# Patient Record
Sex: Female | Born: 1970
Health system: Southern US, Community
[De-identification: ages and names within clinical notes are randomized; demographics above are authoritative.]

## PROBLEM LIST (undated history)

## (undated) DIAGNOSIS — T7840XA Allergy, unspecified, initial encounter: Secondary | ICD-10-CM

## (undated) DIAGNOSIS — K219 Gastro-esophageal reflux disease without esophagitis: Secondary | ICD-10-CM

## (undated) DIAGNOSIS — F329 Major depressive disorder, single episode, unspecified: Secondary | ICD-10-CM

## (undated) DIAGNOSIS — R87612 Low grade squamous intraepithelial lesion on cytologic smear of cervix (LGSIL): Secondary | ICD-10-CM

## (undated) DIAGNOSIS — F32A Depression, unspecified: Secondary | ICD-10-CM

## (undated) DIAGNOSIS — D571 Sickle-cell disease without crisis: Secondary | ICD-10-CM

## (undated) DIAGNOSIS — E785 Hyperlipidemia, unspecified: Secondary | ICD-10-CM

## (undated) DIAGNOSIS — R87610 Atypical squamous cells of undetermined significance on cytologic smear of cervix (ASC-US): Secondary | ICD-10-CM

## (undated) DIAGNOSIS — R87810 Cervical high risk human papillomavirus (HPV) DNA test positive: Secondary | ICD-10-CM

## (undated) HISTORY — DX: Depression, unspecified: F32.A

## (undated) HISTORY — PX: MYOMECTOMY: SHX85

## (undated) HISTORY — DX: Allergy, unspecified, initial encounter: T78.40XA

## (undated) HISTORY — DX: Atypical squamous cells of undetermined significance on cytologic smear of cervix (ASC-US): R87.610

## (undated) HISTORY — DX: Gastro-esophageal reflux disease without esophagitis: K21.9

## (undated) HISTORY — DX: Hyperlipidemia, unspecified: E78.5

## (undated) HISTORY — DX: Major depressive disorder, single episode, unspecified: F32.9

## (undated) HISTORY — DX: Low grade squamous intraepithelial lesion on cytologic smear of cervix (LGSIL): R87.612

## (undated) HISTORY — DX: Cervical high risk human papillomavirus (HPV) DNA test positive: R87.810

## (undated) HISTORY — DX: Sickle-cell disease without crisis: D57.1

---

## 1991-11-02 HISTORY — PX: CHOLECYSTECTOMY: SHX55

## 1994-11-01 HISTORY — PX: TUBAL LIGATION: SHX77

## 2004-05-16 ENCOUNTER — Emergency Department (HOSPITAL_COMMUNITY): Admission: EM | Admit: 2004-05-16 | Discharge: 2004-05-16 | Payer: Self-pay | Admitting: Emergency Medicine

## 2007-11-12 ENCOUNTER — Emergency Department (HOSPITAL_COMMUNITY): Admission: EM | Admit: 2007-11-12 | Discharge: 2007-11-12 | Payer: Self-pay | Admitting: Family Medicine

## 2010-05-01 DIAGNOSIS — R87612 Low grade squamous intraepithelial lesion on cytologic smear of cervix (LGSIL): Secondary | ICD-10-CM

## 2010-05-01 HISTORY — DX: Low grade squamous intraepithelial lesion on cytologic smear of cervix (LGSIL): R87.612

## 2010-05-01 HISTORY — PX: COMBINED HYSTEROSCOPY DIAGNOSTIC / D&C: SUR297

## 2010-05-12 ENCOUNTER — Ambulatory Visit: Payer: Self-pay | Admitting: Gynecology

## 2010-05-18 ENCOUNTER — Ambulatory Visit: Payer: Self-pay | Admitting: Gynecology

## 2010-05-25 ENCOUNTER — Ambulatory Visit: Payer: Self-pay | Admitting: Gynecology

## 2010-05-29 ENCOUNTER — Ambulatory Visit: Payer: Self-pay | Admitting: Gynecology

## 2010-05-29 ENCOUNTER — Ambulatory Visit (HOSPITAL_BASED_OUTPATIENT_CLINIC_OR_DEPARTMENT_OTHER): Admission: RE | Admit: 2010-05-29 | Discharge: 2010-05-29 | Payer: Self-pay | Admitting: Gynecology

## 2010-06-16 ENCOUNTER — Ambulatory Visit: Payer: Self-pay | Admitting: Gynecology

## 2010-09-22 ENCOUNTER — Ambulatory Visit: Payer: Self-pay | Admitting: Gynecology

## 2010-10-09 ENCOUNTER — Ambulatory Visit: Payer: Self-pay | Admitting: Gynecology

## 2010-11-13 ENCOUNTER — Ambulatory Visit
Admission: RE | Admit: 2010-11-13 | Discharge: 2010-11-13 | Payer: Self-pay | Source: Home / Self Care | Attending: Gynecology | Admitting: Gynecology

## 2010-11-13 ENCOUNTER — Other Ambulatory Visit
Admission: RE | Admit: 2010-11-13 | Discharge: 2010-11-13 | Payer: Self-pay | Source: Home / Self Care | Admitting: Gynecology

## 2011-01-18 ENCOUNTER — Ambulatory Visit (INDEPENDENT_AMBULATORY_CARE_PROVIDER_SITE_OTHER): Payer: 59

## 2011-01-18 DIAGNOSIS — D259 Leiomyoma of uterus, unspecified: Secondary | ICD-10-CM

## 2011-01-18 DIAGNOSIS — N920 Excessive and frequent menstruation with regular cycle: Secondary | ICD-10-CM

## 2011-04-02 HISTORY — PX: OTHER SURGICAL HISTORY: SHX169

## 2011-04-05 ENCOUNTER — Institutional Professional Consult (permissible substitution) (INDEPENDENT_AMBULATORY_CARE_PROVIDER_SITE_OTHER): Payer: 59 | Admitting: Gynecology

## 2011-04-05 DIAGNOSIS — N920 Excessive and frequent menstruation with regular cycle: Secondary | ICD-10-CM

## 2011-04-05 DIAGNOSIS — D259 Leiomyoma of uterus, unspecified: Secondary | ICD-10-CM

## 2011-04-07 NOTE — H&P (Signed)
NAMEMEAGHAN, Carrie Romero               ACCOUNT NO.:  [id number]  MEDICAL RECORD NO.:  [id number]  LOCATION:                                FACILITY:  [facility]  PHYSICIAN:  Charlese Gruetzmacher P. Lutisha Knoche, M.D.DATE OF BIRTH:  26-May-1971  DATE OF ADMISSION:  04/20/2011 DATE OF DISCHARGE:                             HISTORY & PHYSICAL   CHIEF COMPLAINT:  Menorrhagia and iron-deficiency anemia.  History of low-grade SIL changes on Pap smear.  HISTORY OF PRESENT ILLNESS:  A 40 year old G3, P1, AB 1 female status post tubal sterilization long history of heavy menses lasting 7 days requiring frequent pad changes and bleed through episodes.  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 She had trial of hormonal manipulation, but again has continued to bleed heavily and wants to proceed with definitive surgery to include attempted TLH.  She has been on Depo Lupron for menstrual suppression to allow for hemoglobin recovery preoperatively.  Past surgical history includes cesarean section, cholecystectomy, tubal sterilization, hysteroscopy.  Past medical history includes migraine headaches.  CURRENT MEDICATIONS:  Imitrex p.r.n. and ferrous sulfate daily.  ALLERGIES:  No medications.  REVIEW OF SYSTEMS:  Noncontributory.  FAMILY HISTORY:  Noncontributory.  SOCIAL HISTORY:  Noncontributory.  ADMISSION PHYSICAL EXAMINATION:  VITAL SIGNS: Afebrile.  Stable. HEENT:  Normal. LUNGS:  Clear. CARDIAC:  Regular rate.  No rubs, murmurs, or gallops. ABDOMEN:  Benign. PELVIC:  External BUS, vagina normal.  Cervix normal.  Uterus retroverted, irregular consistent with myomas.  Adnexa without gross masses or tenderness.  ASSESSMENT:  A 40 year old G3, P1, AB 2 female status post tubal sterilization, history of menorrhagia low-grade dysplasia, status post hysteroscopy D and C, attempted hormonal manipulation  without success. She wants to proceed with hysterectomy.  I reviewed the options to include continued attempts at hormonal manipulation, Jearld Adjutant IUD, Depo- Provera, re-hysteroscopy, attempted multiple myomectomy all of which she rejects and wants to proceed with hysterectomy.  I discussed the various approaches to the hysterectomy to include attempted [facility] laparoscopic- assisted TAH and I think certainly given the situation with past cesarean section attempted  TLH certainly as appropriate, although I did review with her that depending on location of her myomas that this may not be possible that we may need to proceed with a TAH.  She understands the possibility for larger incision a longer recovery all of which she understands and accepts.  Absolute irreversible sterility associated with hysterectomy was reviewed as well as sexuality following hysterectomy and the potential for persistent orgasmic dysfunction as well as persistent dyspareunia was discussed, understood, and accepted. The ovarian conservation issue was also reviewed the options of keeping both ovaries for continued hormone production recognizing the potential for ovarian disease in the future both benign and malignant requiring reoperation or treatment was discussed versus removing both ovaries and the risk of hypoestrogenic some to include accelerated cardiovascular risk Osteoporosis potential need for hormone replacement therapy and the risks of hormone replacement therapy was discussed with the WHI study was all reviewed with her.  She wants to keep both ovaries, although she does give me permission  to remove one or both ovaries if at my discretion intraoperatively I feel as the best choice, and she clearly accepts the risks for ovarian disease in the future, and the potential for reoperation as well as the risk of ovarian cancer.  Expected intraoperative courses were reviewed with her as well as instrumentation, trocar  placement, multiple port sites, use of electrocautery, harmonic scalpel, clips, laser were all reviewed and again the risk that we will convert to an open procedure with a total abdominal hysterectomy was discussed, understood, and accepted.  The risk of infection requiring prolonged antibiotics as well as the risk of abscess formation requiring reoperation abscess drainage was reviewed with her.  The risk of hemorrhage necessitating transfusion and the risks of transfusion including transfusion reaction, hepatitis, HIV, mad cow disease and other unknown entities was all discussed, understood, and accepted.  The risk of inadvertent injury to internal organs either immediately recognized or delayed recognized including bowel, bladder ureters, vessels, and nerves necessitating major exploratory reparative surgeries and future reparative surgeries, bowel resection, bladder repair, ureteral damage repair, ostomy formation was all discussed, understood and accepted.  The patient's questions were answered to her satisfaction.  She is ready to proceed with surgery.     Carrie Romero P. Audie Box, M.D.     TPF/MEDQ  D:  04/05/2011  T:  04/05/2011  Job:  [id number]  Electronically Signed by Colin Broach M.D. on 04/07/2011 02:54:16 PM

## 2011-04-13 ENCOUNTER — Encounter (HOSPITAL_COMMUNITY): Payer: 59

## 2011-04-13 ENCOUNTER — Other Ambulatory Visit: Payer: Self-pay | Admitting: Gynecology

## 2011-04-13 LAB — CBC
Hemoglobin: 11.1 g/dL — ABNORMAL LOW (ref 12.0–15.0)
MCH: 26.6 pg (ref 26.0–34.0)
MCV: 81.1 fL (ref 78.0–100.0)

## 2011-04-20 ENCOUNTER — Other Ambulatory Visit: Payer: Self-pay | Admitting: Gynecology

## 2011-04-20 ENCOUNTER — Ambulatory Visit (HOSPITAL_COMMUNITY)
Admission: RE | Admit: 2011-04-20 | Discharge: 2011-04-21 | Disposition: A | Discharge status: Home / Self Care | Payer: 59 | Source: Ambulatory Visit | Attending: Gynecology | Admitting: Gynecology

## 2011-04-20 DIAGNOSIS — Z01812 Encounter for preprocedural laboratory examination: Secondary | ICD-10-CM | POA: Insufficient documentation

## 2011-04-20 DIAGNOSIS — N8000 Endometriosis of the uterus, unspecified: Secondary | ICD-10-CM | POA: Insufficient documentation

## 2011-04-20 DIAGNOSIS — N871 Moderate cervical dysplasia: Secondary | ICD-10-CM

## 2011-04-20 DIAGNOSIS — N926 Irregular menstruation, unspecified: Secondary | ICD-10-CM

## 2011-04-20 DIAGNOSIS — Z01818 Encounter for other preprocedural examination: Secondary | ICD-10-CM | POA: Insufficient documentation

## 2011-04-20 DIAGNOSIS — N920 Excessive and frequent menstruation with regular cycle: Secondary | ICD-10-CM | POA: Insufficient documentation

## 2011-04-20 DIAGNOSIS — D251 Intramural leiomyoma of uterus: Secondary | ICD-10-CM | POA: Insufficient documentation

## 2011-04-20 DIAGNOSIS — D259 Leiomyoma of uterus, unspecified: Secondary | ICD-10-CM

## 2011-04-20 DIAGNOSIS — N879 Dysplasia of cervix uteri, unspecified: Secondary | ICD-10-CM | POA: Insufficient documentation

## 2011-04-20 DIAGNOSIS — D509 Iron deficiency anemia, unspecified: Secondary | ICD-10-CM | POA: Insufficient documentation

## 2011-04-20 DIAGNOSIS — D252 Subserosal leiomyoma of uterus: Secondary | ICD-10-CM | POA: Insufficient documentation

## 2011-04-21 LAB — CBC
HCT: 27 % — ABNORMAL LOW (ref 36.0–46.0)
MCV: 80.8 fL (ref 78.0–100.0)
RBC: 3.34 MIL/uL — ABNORMAL LOW (ref 3.87–5.11)
WBC: 10.3 10*3/uL (ref 4.0–10.5)

## 2011-04-22 NOTE — Discharge Summary (Signed)
  NAMESHANTICE, MENGER               ACCOUNT NO.:  [id number]  MEDICAL RECORD NO.:  [id number]  LOCATION:  9306                          FACILITY:  [facility]  PHYSICIAN:  Donelle Baba P. Clorinda Wyble, M.D.DATE OF BIRTH:  1971/09/28  DATE OF ADMISSION:  04/20/2011 DATE OF DISCHARGE:  04/21/2011                              DISCHARGE SUMMARY   DISCHARGE DIAGNOSES:  Menorrhagia, leiomyoma, iron-deficiency anemia, low-grade cervical dysplasia.  PROCEDURE:  Total laparoscopic hysterectomy, April 20, 2011.  PATHOLOGY:  [id number]  inactive endometrium, leiomyomata, adenomyosis, no residual dysplasia.  HOSPITAL COURSE:  A 40 year old underwent uncomplicated total laparoscopic hysterectomy, April 20, 2011.  Her postoperative course was uncomplicated.  She was discharged on postoperative day #1, ambulating well, tolerating a regular diet, voiding with a postoperative hemoglobin of 8.9, preoperative hemoglobin 11.1.  The patient received discharge instructions, precautions, and followup.  She will be seen in the office 2 weeks following discharge and received a prescription for Tylox #25 one to two p.o. q.6 h. p.r.n. pain and Phenergan 25 mg #15 one p.o. q.6 h. p.r.n. nausea.     Billyjack Trompeter P. Audie Box, M.D.     TPF/MEDQ  D:  04/21/2011  T:  04/22/2011  Job:  [id number]  Electronically Signed by Colin Broach M.D. on 04/22/2011 03:49:23 PM

## 2011-04-22 NOTE — Op Note (Signed)
NAMETYQUASIA, PANT               ACCOUNT NO.:  [id number]  MEDICAL RECORD NO.:  [id number]  LOCATION:  9306                          FACILITY:  [facility]  PHYSICIAN:  Maddux Vanscyoc P. Janaisha Tolsma, M.D.DATE OF BIRTH:  19-May-1971  DATE OF PROCEDURE:  04/20/2011 DATE OF DISCHARGE:                              OPERATIVE REPORT   PREOPERATIVE DIAGNOSES:  Menorrhagia, leiomyoma, iron-deficiency anemia, cervical dysplasia low grade.  POSTOPERATIVE DIAGNOSES:  Menorrhagia, leiomyoma, iron-deficiency anemia, cervical dysplasia low grade.  PROCEDURE:  Total laparoscopic hysterectomy.  SURGEON:  Alisandra Son P. Dailynn Nancarrow, MD  ASSISTANT:  Lily Peer.  ANESTHESIA:  General.  ESTIMATED BLOOD LOSS:  100 mL.  COMPLICATIONS:  None.  SPECIMEN:  Uterus to Pathology.  FINDINGS:  EUA, external BUS, vagina normal.  Cervix normal.  Bimanual uterus normal size, midline mobile.  Adnexa without masses. Laparoscopic anterior cul-de-sac.  Mild vesicouterine peritoneal scarring from previous C- section, posterior cul-de-sac, normal uterus, overall normal in size, irregular consistent with subserosal and intramural leiomyoma.  Right and left fallopian tubes with evidence of prior tubal sterilization, otherwise normal.  Right and left ovaries grossly normal free and mobile.  Upper abdominal exam was grossly normal, liver smooth, no significant perihepatic adhesions noted or upper abdominal pathology.  PROCEDURE:  The patient was taken to the operating room, underwent general anesthesia, placed in low dorsal lithotomy position, received abdominal perineal vaginal preparation with Betadine solution.  EUA performed.  Cervix visualized with a speculum.  Anterior lip was grasped with a  single-tooth tenaculum.  Uterus was sounded and cervix measured and subsequently the appropriate sized RUMI manipulator and CLO cup were placed without difficulty.  The intrauterine balloon insufflated.  The tenaculum removed and the cup  seated in the upper vagina around the cervix.  An indwelling Foley catheterization was then performed.  The patient draped in usual fashion.  A repeat transverse infraumbilical incision was made using the 10-mm direct entry Optiview trocar.  The abdomen was directly entered under direct visualization without difficulty and subsequently insufflated.  Right and left 5-mm suprapubic ports were then placed under direct visualization after transillumination for the best without difficulty.  Examination of pelvic organs, upper abdominal exam was carried out with findings noted above.  Using the harmonic scalpel, the right uterine ovarian pedicle was identified, isolated, and transected.  The broad ligament again transected to the level of the round ligament using the harmonic scalpel.  The round ligament was then transected the parametrial planes developed.  The uterine artery was visualized, bipolar cauterized to a flow of zero and subsequently transected with a harmonic scalpel.  The similar procedure was then carried out on the other side.  The vesicouterine peritoneal fold was then developed using the harmonic scalpel and subsequently the vagina was entered anteriorly over lying the KOH cup under pressure, using the harmonic scalpel.  The uterus was then circumferentially excised from the vagina again using the harmonic scalpel without difficulty and the freed uterine specimen was then withdrawn into the vagina to maintain the pneumoperitoneum.  Using the Endo stitch suture applier 0 Vicryl figure-of-eight interrupted sutures were placed to close the vagina starting with two angle sutures and then closing the intervening vaginal space.  Pelvis was copiously irrigated. Several small bleeding points were addressed with a bipolar cautery. The gas allowed to slowly escape and the pelvis was reinspected under low-pressure site as well as the ovarian pedicles all of which showed adequate  hemostasis under low pressure.  Of note for use of the Endo stitch, the right lower 5-mm suprapubic port was replaced with a 10-mm port again under direct visualization after transillumination for the vessels without difficulty.  The suprapubic ports were all removed and the procedure.  Hemostasis again visualized at the peritoneal port sites and the infraumbilical port was then backed out under direct visualization showing adequate hemostasis.  No evidence of hernia formation.  All skin incision sites were injected using 0.25% Marcaine interrupted TED 0 Vicryl subcutaneous fascial stitch was placed at the infraumbilical and the right lower suprapubic port site and all skin incisions were closed using 4-0 plain suture in interrupted cuticular stitch.  Sterile dressings were applied.  Bimanual assured secure vaginal closure.  Specimen was sent to Pathology noting cervical dysplasia in the history, clear free-flowing yellow urine was noted in the Foley at the end of the procedure.  The patient received intraoperative Toradol was awakened without difficulty and taken to recovery room in good condition having tolerated the procedure well.     Kingslee Dowse P. Audie Box, M.D.     TPF/MEDQ  D:  04/20/2011  T:  04/21/2011  Job:  [id number]  Electronically Signed by Colin Broach M.D. on 04/22/2011 09:16:25 AM

## 2011-05-04 ENCOUNTER — Ambulatory Visit (INDEPENDENT_AMBULATORY_CARE_PROVIDER_SITE_OTHER): Payer: 59 | Admitting: Gynecology

## 2011-05-04 DIAGNOSIS — N949 Unspecified condition associated with female genital organs and menstrual cycle: Secondary | ICD-10-CM

## 2011-05-26 ENCOUNTER — Ambulatory Visit: Payer: 59 | Admitting: Gynecology

## 2011-05-26 ENCOUNTER — Encounter: Payer: Self-pay | Admitting: Gynecology

## 2011-05-26 ENCOUNTER — Ambulatory Visit (INDEPENDENT_AMBULATORY_CARE_PROVIDER_SITE_OTHER): Payer: 59 | Admitting: Gynecology

## 2011-05-26 DIAGNOSIS — Z9889 Other specified postprocedural states: Secondary | ICD-10-CM

## 2011-05-26 NOTE — Progress Notes (Signed)
Patient presents postop approximately a month from her TLH doing well with no complaints.  Exam today: abdominal incisions healed nicely. Pelvic exam external BUS vagina normal cuff intact bimanual without masses or tenderness  Assessment and plan: 1 month postop status post TL H. Doing well pathology showed adenomyosis and leiomyoma. She did have history of low-grade SIL Pap smear in July a year ago with a normal Pap in January. I recommended she continue with annual Pap smears of the vaginal cuff at this point given this history. She is 39 and has not had a mammogram I recommended she get a screening mammogram and otherwise followup in 6 months for an annual exam. I filled out paperwork to return to work now she'll return on August 1 and I recommended that abstaining from intercourse for another 2 weeks to be 6 weeks out. Assuming she does well then she'll followup at her annual exam.

## 2011-06-01 ENCOUNTER — Telehealth: Payer: Self-pay | Admitting: *Deleted

## 2011-06-01 NOTE — Telephone Encounter (Signed)
Not unusual to spot. As long as she's not having any other symptoms then I would watch for now. If it continues or certainly worsens worsens office visit

## 2011-06-01 NOTE — Telephone Encounter (Signed)
PT INFORMED WITH THE BELOW,PT WILL MAKE OV IF SYMPTOMS WORSEN.

## 2011-06-01 NOTE — Telephone Encounter (Signed)
(  PT HAD TLH SURGERY ON 04/20/11) FYI PT WANTED TO LET KNOW THAT TODAY SHE NOTICED SOME SPOTTING WHEN SHE WENT TO BATHROOM. PT STATES SHE FEELS FINE. BUT WANTED YOU TO KNOW ABOUT THE SPOTTING. PLEASE ADVISE.

## 2011-06-04 ENCOUNTER — Other Ambulatory Visit: Payer: Self-pay | Admitting: *Deleted

## 2011-06-04 MED ORDER — PROMETHAZINE HCL 25 MG PO TABS: 25.0000 mg | ORAL_TABLET | Freq: Four times a day (QID) | ORAL | Status: AC | PRN

## 2011-06-04 MED ORDER — FLUCONAZOLE 150 MG PO TABS: 150.0000 mg | ORAL_TABLET | Freq: Once | ORAL | Status: AC

## 2011-07-22 LAB — POCT URINALYSIS DIP (DEVICE)
Bilirubin Urine: NEGATIVE
Ketones, ur: NEGATIVE
Ketones, ur: NEGATIVE
Leukocytes, UA: NEGATIVE
Operator id: 235561
Protein, ur: NEGATIVE
Specific Gravity, Urine: 1.02
Urobilinogen, UA: 0.2
pH: 5.5

## 2011-07-22 LAB — GC/CHLAMYDIA PROBE AMP, GENITAL: GC Probe Amp, Genital: NEGATIVE

## 2011-11-08 ENCOUNTER — Telehealth: Payer: Self-pay | Admitting: *Deleted

## 2011-11-08 DIAGNOSIS — Z131 Encounter for screening for diabetes mellitus: Secondary | ICD-10-CM

## 2011-11-08 DIAGNOSIS — Z13220 Encounter for screening for lipoid disorders: Secondary | ICD-10-CM

## 2011-11-08 DIAGNOSIS — Z01419 Encounter for gynecological examination (general) (routine) without abnormal findings: Secondary | ICD-10-CM

## 2011-11-08 NOTE — Telephone Encounter (Signed)
Okay for CBC, comprehensive metabolic panel, lipid profile, urinalysis and TSH. Orders were placed

## 2011-11-08 NOTE — Telephone Encounter (Signed)
Pt has annual scheduled for jan 23, pt would like to have labs drawn prior to appointment, pt asked if she could have TSH, CMP, CBC with routine labs. Okay to do labs?

## 2011-11-08 NOTE — Telephone Encounter (Signed)
Lm on pt vm okay to do this.

## 2011-11-18 ENCOUNTER — Other Ambulatory Visit: Payer: 59

## 2011-11-18 ENCOUNTER — Other Ambulatory Visit: Payer: Self-pay | Admitting: Gynecology

## 2011-11-18 DIAGNOSIS — Z131 Encounter for screening for diabetes mellitus: Secondary | ICD-10-CM

## 2011-11-18 DIAGNOSIS — Z01419 Encounter for gynecological examination (general) (routine) without abnormal findings: Secondary | ICD-10-CM

## 2011-11-18 DIAGNOSIS — Z113 Encounter for screening for infections with a predominantly sexual mode of transmission: Secondary | ICD-10-CM

## 2011-11-18 DIAGNOSIS — Z13220 Encounter for screening for lipoid disorders: Secondary | ICD-10-CM

## 2011-11-18 LAB — URINALYSIS, ROUTINE W REFLEX MICROSCOPIC
Bilirubin Urine: NEGATIVE
Glucose, UA: NEGATIVE mg/dL
Nitrite: NEGATIVE
Specific Gravity, Urine: 1.015 (ref 1.005–1.030)

## 2011-11-19 ENCOUNTER — Other Ambulatory Visit: Payer: Self-pay | Admitting: *Deleted

## 2011-11-19 DIAGNOSIS — E7800 Pure hypercholesterolemia, unspecified: Secondary | ICD-10-CM

## 2011-11-19 LAB — CBC WITH DIFFERENTIAL/PLATELET
Basophils Absolute: 0 10*3/uL (ref 0.0–0.1)
Eosinophils Absolute: 0 10*3/uL (ref 0.0–0.7)
Eosinophils Relative: 1 % (ref 0–5)
HCT: 36.2 % (ref 36.0–46.0)
Hemoglobin: 11.8 g/dL — ABNORMAL LOW (ref 12.0–15.0)
Lymphs Abs: 1.6 10*3/uL (ref 0.7–4.0)
MCHC: 32.6 g/dL (ref 30.0–36.0)
Monocytes Absolute: 0.5 10*3/uL (ref 0.1–1.0)
Neutro Abs: 2.3 10*3/uL (ref 1.7–7.7)
RBC: 4.29 MIL/uL (ref 3.87–5.11)
WBC: 4.4 10*3/uL (ref 4.0–10.5)

## 2011-11-19 LAB — LIPID PANEL
HDL: 48 mg/dL (ref >39)
Total CHOL/HDL Ratio: 4.6 Ratio
VLDL: 19 mg/dL (ref 0–40)

## 2011-11-19 LAB — COMPREHENSIVE METABOLIC PANEL
AST: 16 U/L (ref 0–37)
Albumin: 4.1 g/dL (ref 3.5–5.2)
Alkaline Phosphatase: 113 U/L (ref 39–117)
BUN: 9 mg/dL (ref 6–23)
CO2: 24 mEq/L (ref 19–32)
Calcium: 9 mg/dL (ref 8.4–10.5)
Sodium: 141 mEq/L (ref 135–145)
Total Bilirubin: 0.3 mg/dL (ref 0.3–1.2)

## 2011-11-24 ENCOUNTER — Ambulatory Visit (INDEPENDENT_AMBULATORY_CARE_PROVIDER_SITE_OTHER): Payer: 59 | Admitting: Gynecology

## 2011-11-24 ENCOUNTER — Other Ambulatory Visit: Payer: Self-pay | Admitting: Gynecology

## 2011-11-24 ENCOUNTER — Other Ambulatory Visit (HOSPITAL_COMMUNITY)
Admission: RE | Admit: 2011-11-24 | Discharge: 2011-11-24 | Disposition: A | Discharge status: Home / Self Care | Payer: 59 | Source: Ambulatory Visit | Attending: Gynecology | Admitting: Gynecology

## 2011-11-24 ENCOUNTER — Encounter: Payer: Self-pay | Admitting: Gynecology

## 2011-11-24 VITALS — BP 120/78 | Ht 61.5 in | Wt 173.0 lb

## 2011-11-24 DIAGNOSIS — Z01419 Encounter for gynecological examination (general) (routine) without abnormal findings: Secondary | ICD-10-CM

## 2011-11-24 DIAGNOSIS — N644 Mastodynia: Secondary | ICD-10-CM

## 2011-11-24 DIAGNOSIS — Z1231 Encounter for screening mammogram for malignant neoplasm of breast: Secondary | ICD-10-CM

## 2011-11-24 DIAGNOSIS — E7800 Pure hypercholesterolemia, unspecified: Secondary | ICD-10-CM

## 2011-11-24 NOTE — Patient Instructions (Addendum)
Schedule mammogram.  Continue with exercise program. Repeat fasting lipid profile in 6 months.  Follow up in one year for check up

## 2011-11-24 NOTE — Progress Notes (Signed)
Carrie CHAVERO 08-18-1971 [id number]        41 y.o.  for annual exam.  Status post TLH June 2012 doing well. She does note some bilateral breast tenderness since starting a high-impact exercise program.  Past medical history,surgical history, medications, allergies, family history and social history were all reviewed and documented in the EPIC chart. ROS:  Was performed and pertinent positives and negatives are included in the history.  Exam: Sherrilyn Rist chaperone present Filed Vitals:   11/24/11 0902  BP: 120/78   General appearance  Normal Skin grossly normal Head/Neck normal with no cervical or supraclavicular adenopathy thyroid normal Lungs  clear Cardiac RR, without RMG Abdominal  soft, nontender, without masses, organomegaly or hernia Breasts  examined lying and sitting without masses, retractions, discharge or axillary adenopathy. Pelvic  Ext/BUS/vagina  normal Pap of cuff done  Adnexa  Without masses or tenderness    Anus and perineum  normal   Rectovaginal  normal sphincter tone without palpated masses or tenderness.    Assessment/Plan:  41 y.o. female for annual exam.    1. Bilateral breast tenderness. Her exam is normal I think this is do to her new exercise program where she is doing a lot of jumping up and down. Recommended getting her screening mammogram. SBE monthly. As long as she does not feel any abnormalities and this tenderness resolves over time we'll follow. If it worsens persists or she feels any abnormality she knows to follow up with me. 2. Pap smear. She is status post TLH but given her history of low-grade SIL in 2011 I did a Pap smear of her cuff today. 3. Hypercholesterolemia. She had her labs drawn earlier which did show a Cholesterol 221 and an LDL of 154. I recommended continuing with her new exercise program, attempt at weight loss, low fat diet. She will repeat a fasting lipid profile in 6 months. 4. Anemia. Patient's hemoglobin is 11.8 with microcytic  hypochromic indices.  Recommend an iron supplement for now. She is status post TLH I suspect she will correct her self fairly rapidly.  Assuming she continues well then she will see me in a year, sooner as needed    Dara Lords MD, 9:21 AM 11/24/2011

## 2011-12-01 ENCOUNTER — Ambulatory Visit
Admission: RE | Admit: 2011-12-01 | Discharge: 2011-12-01 | Disposition: A | Discharge status: Home / Self Care | Payer: 59 | Source: Ambulatory Visit | Attending: Gynecology | Admitting: Gynecology

## 2011-12-01 DIAGNOSIS — Z1231 Encounter for screening mammogram for malignant neoplasm of breast: Secondary | ICD-10-CM

## 2011-12-08 ENCOUNTER — Other Ambulatory Visit: Payer: Self-pay | Admitting: *Deleted

## 2011-12-08 ENCOUNTER — Other Ambulatory Visit: Payer: Self-pay | Admitting: Gynecology

## 2011-12-08 DIAGNOSIS — N63 Unspecified lump in unspecified breast: Secondary | ICD-10-CM

## 2011-12-15 ENCOUNTER — Telehealth: Payer: Self-pay | Admitting: *Deleted

## 2011-12-15 NOTE — Telephone Encounter (Signed)
Pt informed of recent pap results.

## 2011-12-16 ENCOUNTER — Ambulatory Visit
Admission: RE | Admit: 2011-12-16 | Discharge: 2011-12-16 | Disposition: A | Discharge status: Home / Self Care | Payer: 59 | Source: Ambulatory Visit | Attending: Gynecology | Admitting: Gynecology

## 2011-12-16 DIAGNOSIS — N63 Unspecified lump in unspecified breast: Secondary | ICD-10-CM

## 2011-12-17 ENCOUNTER — Other Ambulatory Visit: Payer: 59

## 2012-02-02 ENCOUNTER — Ambulatory Visit (INDEPENDENT_AMBULATORY_CARE_PROVIDER_SITE_OTHER): Payer: 59 | Admitting: Physician Assistant

## 2012-02-02 VITALS — BP 128/79 | HR 80 | Temp 98.0°F | Resp 16 | Ht 61.0 in | Wt 170.0 lb

## 2012-02-02 DIAGNOSIS — G43909 Migraine, unspecified, not intractable, without status migrainosus: Secondary | ICD-10-CM | POA: Insufficient documentation

## 2012-02-02 DIAGNOSIS — J019 Acute sinusitis, unspecified: Secondary | ICD-10-CM

## 2012-02-02 DIAGNOSIS — D573 Sickle-cell trait: Secondary | ICD-10-CM

## 2012-02-02 MED ORDER — RANITIDINE HCL 300 MG PO TABS: 300.0000 mg | ORAL_TABLET | Freq: Every day | ORAL | Status: DC

## 2012-02-02 MED ORDER — FLUTICASONE PROPIONATE 50 MCG/ACT NA SUSP: 2.0000 | Freq: Every day | NASAL | Status: DC

## 2012-02-02 MED ORDER — AMOXICILLIN 500 MG PO CAPS: ORAL_CAPSULE | ORAL | Status: DC

## 2012-02-02 MED ORDER — AZELASTINE HCL 0.05 % OP SOLN: 1.0000 [drp] | Freq: Two times a day (BID) | OPHTHALMIC | Status: DC

## 2012-02-02 NOTE — Patient Instructions (Signed)
Cold air humidifier, cold refrigerator pack to eyes

## 2012-02-02 NOTE — Progress Notes (Signed)
  Subjective:    Patient ID: Carrie Romero, female    DOB: [birth year], 41 y.o.   MRN: [id number]  HPI 41 y.o. AAF c/o puffy, swollen, itchy eyes R>L.  She is currently under the care of an opthalmologist who is treating her with steroid/antibiotic eye drops.  She is experiencing Runny nose, sneezing, some yellow d/c from nose.  Symptoms now for about 2 weeks.  She had a recheck today with the opthalmologist, but he had to reschedule for Monday bc he is sick-her eyes do seem to be improving, but her congestion remains severe. Some mucus from nose is blood streaked.  Takes Zyrtec BID with minimal relief.  No f/c.  No cough. No vision changes.  No FB risk.  Not painful, just pressure secondary to swelling.  She had a sinus headache yesterday that worsened with leaning forward.  Review of Systems  All other systems reviewed and are negative.       Objective:   Physical Exam  Nursing note and vitals reviewed. Constitutional: She is oriented to person, place, and time. She appears well-developed and well-nourished.  HENT:  Head: Normocephalic and atraumatic.  Right Ear: External ear normal.  Left Ear: External ear normal.  Mouth/Throat: Oropharynx is clear and moist. No oropharyngeal exudate (PND no exudate).       B turbinates swollen, boggy, and bluish  Eyes: EOM are normal. Pupils are equal, round, and reactive to light. Right eye exhibits chemosis (present B lower lid/palpebral conjunctivae). Right eye exhibits no discharge, no exudate and no hordeolum. No foreign body present in the right eye. Left eye exhibits chemosis. Left eye exhibits no discharge, no exudate and no hordeolum. No foreign body present in the left eye. Right conjunctiva is not injected. Right conjunctiva has no hemorrhage. Left conjunctiva is not injected. Left conjunctiva has no hemorrhage. No scleral icterus. Right eye exhibits normal extraocular motion and no nystagmus. Left eye exhibits normal extraocular motion and no  nystagmus. Right pupil is round and reactive. Left pupil is round and reactive. Pupils are equal.    Cardiovascular: Normal rate, regular rhythm and normal heart sounds.   Pulmonary/Chest: Effort normal and breath sounds normal.  Neurological: She is alert and oriented to person, place, and time.  Skin: Skin is warm and dry.  Psychiatric: She has a normal mood and affect. Her behavior is normal.          Assessment & Plan:  Allergic rhinitis with Possible sinusitis and Allergic conjunctivitis-Continue current treatment by opthalmologist.  Add optivar, zantac (to further reduce histamine), continue Zyrtec, start flonase and cold air humidifier.  If no response or s/sx of sinusitis increase in 2 days, start amoxicillin.

## 2012-02-04 ENCOUNTER — Telehealth: Payer: Self-pay

## 2012-02-04 NOTE — Telephone Encounter (Signed)
Patient was just prescribed antibiotics but forgot to tell provider that she gets yeast infections when on them. Requests Diflucan called in, 1 extra refill if possible since she is on 3 weeks worth of antibiotics.  Note: uses [facility], which isn't open on weekends. *Please call this RX in to [facility] on [address].

## 2012-02-05 MED ORDER — FLUCONAZOLE 150 MG PO TABS: ORAL_TABLET | ORAL | Status: AC

## 2012-02-05 NOTE — Telephone Encounter (Signed)
Diflucan sent to [facility], her antibiotics are two twice daily and she should be on them for ten days (not three weeks)

## 2012-02-05 NOTE — Telephone Encounter (Signed)
Patient notified

## 2012-02-05 NOTE — Telephone Encounter (Signed)
Can we rx Diflucan for patient to [facility]?

## 2012-02-11 ENCOUNTER — Ambulatory Visit (INDEPENDENT_AMBULATORY_CARE_PROVIDER_SITE_OTHER): Payer: 59 | Admitting: Internal Medicine

## 2012-02-11 VITALS — BP 122/79 | HR 73 | Temp 98.2°F | Resp 16 | Ht 61.75 in | Wt 169.0 lb

## 2012-02-11 DIAGNOSIS — H1045 Other chronic allergic conjunctivitis: Secondary | ICD-10-CM

## 2012-02-11 DIAGNOSIS — H1010 Acute atopic conjunctivitis, unspecified eye: Secondary | ICD-10-CM

## 2012-02-11 DIAGNOSIS — J309 Allergic rhinitis, unspecified: Secondary | ICD-10-CM

## 2012-02-11 MED ORDER — PREDNISONE 20 MG PO TABS: ORAL_TABLET | ORAL | Status: DC

## 2012-02-11 NOTE — Progress Notes (Signed)
  Subjective:    Patient ID: Carrie Romero, female    DOB: Jan 10, 1971, 41 y.o.   MRN: [id number]  HPIPresents with red and swollen eyes. Was on steroid drops from her ophthalmologist but had to discontinue them last week after using them for too long and after the ophthalmologist concerned that her intraocular pressure had reached its maximum level. She has a history of spring allergies and in fact year-round allergies which often affects her eyes as well as her nose and throat. There is no history of asthma Her vision is intact but her eyes to itch and her lives are very swollen and starting to hurt.    Review of Systems     Objective:   Physical Exam Vital signs stable The lids of both eyes are red and swollen upper and lower Conjunctivae is injected throughout both eyes Pupils are equal round and reactive to light and accommodation The nares are boggy with thin clear rhinorrhea Throat is clear There no nodes Lungs are clear    Assessment & Plan:  Problem #1 allergic conjunctivitis Problem #2 allergic rhinitis  Prednisone 80 mg to 0/8 days/ocular lubricant/zyrtec 10 mg daily/Benadryl at bedtime Recheck if not responding in 48-72 hours

## 2012-02-12 ENCOUNTER — Encounter: Payer: Self-pay | Admitting: Internal Medicine

## 2012-04-30 ENCOUNTER — Ambulatory Visit (INDEPENDENT_AMBULATORY_CARE_PROVIDER_SITE_OTHER): Payer: 59 | Admitting: Emergency Medicine

## 2012-04-30 VITALS — BP 116/69 | HR 78 | Temp 98.6°F | Resp 16 | Ht 62.25 in | Wt 167.8 lb

## 2012-04-30 DIAGNOSIS — B9689 Other specified bacterial agents as the cause of diseases classified elsewhere: Secondary | ICD-10-CM

## 2012-04-30 DIAGNOSIS — B379 Candidiasis, unspecified: Secondary | ICD-10-CM

## 2012-04-30 DIAGNOSIS — N760 Acute vaginitis: Secondary | ICD-10-CM

## 2012-04-30 DIAGNOSIS — A499 Bacterial infection, unspecified: Secondary | ICD-10-CM

## 2012-04-30 DIAGNOSIS — N898 Other specified noninflammatory disorders of vagina: Secondary | ICD-10-CM

## 2012-04-30 DIAGNOSIS — N949 Unspecified condition associated with female genital organs and menstrual cycle: Secondary | ICD-10-CM

## 2012-04-30 LAB — POCT WET PREP WITH KOH: RBC Wet Prep HPF POC: NEGATIVE

## 2012-04-30 MED ORDER — METRONIDAZOLE 500 MG PO TABS: 500.0000 mg | ORAL_TABLET | Freq: Two times a day (BID) | ORAL | Status: AC

## 2012-04-30 MED ORDER — FLUCONAZOLE 150 MG PO TABS: 150.0000 mg | ORAL_TABLET | Freq: Once | ORAL | Status: AC

## 2012-04-30 NOTE — Progress Notes (Signed)
Subjective:    Patient ID: Carrie Romero, female    DOB: 11/18/1970, 41 y.o.   MRN: [id number]  HPI patient is one-day history of a fishy odor to her vaginal area she does not have a true vaginal discharge which is has a bad odor. She has had no change in sexual partner she is status post hysterectomy.    Review of Systems     Objective:   Physical Exam examination of the lower abdomen reveals no tenderness. The patient has some slight inflammation of the vaginal wall with a minimal amount of a whitish discharge   Results for orders placed in visit on 11/18/11  HIV ANTIBODY (ROUTINE TESTING)      Component Value Range   HIV NON REACTIVE  NON REACTIVE  CBC WITH DIFFERENTIAL      Component Value Range   WBC 4.4  4.0 - 10.5 K/uL   RBC 4.29  3.87 - 5.11 MIL/uL   Hemoglobin 11.8 (*) 12.0 - 15.0 g/dL   HCT 40.9  81.1 - 91.4 %   MCV 84.4  78.0 - 100.0 fL   MCH 27.5  26.0 - 34.0 pg   MCHC 32.6  30.0 - 36.0 g/dL   RDW 78.2  95.6 - 21.3 %   Platelets 318  150 - 400 K/uL   Neutrophils Relative 52  43 - 77 %   Neutro Abs 2.3  1.7 - 7.7 K/uL   Lymphocytes Relative 37  12 - 46 %   Lymphs Abs 1.6  0.7 - 4.0 K/uL   Monocytes Relative 10  3 - 12 %   Monocytes Absolute 0.5  0.1 - 1.0 K/uL   Eosinophils Relative 1  0 - 5 %   Eosinophils Absolute 0.0  0.0 - 0.7 K/uL   Basophils Relative 1  0 - 1 %   Basophils Absolute 0.0  0.0 - 0.1 K/uL   Smear Review Criteria for review not met    COMPREHENSIVE METABOLIC PANEL      Component Value Range   Sodium 141  135 - 145 mEq/L   Potassium 4.0  3.5 - 5.3 mEq/L   Chloride 109  96 - 112 mEq/L   CO2 24  19 - 32 mEq/L   Glucose, Bld 98  70 - 99 mg/dL   BUN 9  6 - 23 mg/dL   Creat 0.86  5.78 - 4.69 mg/dL   Total Bilirubin 0.3  0.3 - 1.2 mg/dL   Alkaline Phosphatase 113  39 - 117 U/L   AST 16  0 - 37 U/L   ALT 16  0 - 35 U/L   Total Protein 7.2  6.0 - 8.3 g/dL   Albumin 4.1  3.5 - 5.2 g/dL   Calcium 9.0  8.4 - 62.9 mg/dL  LIPID PANEL   Component Value Range   Cholesterol 221 (*) 0 - 200 mg/dL   Triglycerides 94  <528 mg/dL   HDL 48  >41 mg/dL   Total CHOL/HDL Ratio 4.6     VLDL 19  0 - 40 mg/dL   LDL Cholesterol 324 (*) 0 - 99 mg/dL  URINALYSIS, ROUTINE W REFLEX MICROSCOPIC      Component Value Range   Color, Urine YELLOW  YELLOW   APPearance CLEAR  CLEAR   Specific Gravity, Urine 1.015  1.005 - 1.030   pH 6.0  5.0 - 8.0   Glucose, UA NEG  NEG mg/dL   Bilirubin Urine NEG  NEG  Ketones, ur NEG  NEG mg/dL   Hgb urine dipstick NEG  NEG   Protein, ur NEG  NEG mg/dL   Urobilinogen, UA 0.2  0.0 - 1.0 mg/dL   Nitrite NEG  NEG   Leukocytes, UA NEG  NEG  TSH      Component Value Range   TSH 1.046  0.350 - 4.500 uIU/mL    Results for orders placed in visit on 04/30/12  POCT WET PREP WITH KOH      Component Value Range   Trichomonas, UA Negative     Clue Cells Wet Prep HPF POC 1-2     Epithelial Wet Prep HPF POC 4-7     Yeast Wet Prep HPF POC neg     Bacteria Wet Prep HPF POC 3+     RBC Wet Prep HPF POC neg     WBC Wet Prep HPF POC 2-4     KOH Prep POC Negative        Assessment & Plan:

## 2012-05-26 ENCOUNTER — Ambulatory Visit (INDEPENDENT_AMBULATORY_CARE_PROVIDER_SITE_OTHER): Payer: 59 | Admitting: Physician Assistant

## 2012-05-26 VITALS — BP 128/84 | HR 82 | Temp 97.3°F | Resp 16 | Ht 62.0 in | Wt 168.0 lb

## 2012-05-26 DIAGNOSIS — H1010 Acute atopic conjunctivitis, unspecified eye: Secondary | ICD-10-CM

## 2012-05-26 DIAGNOSIS — H6980 Other specified disorders of Eustachian tube, unspecified ear: Secondary | ICD-10-CM

## 2012-05-26 DIAGNOSIS — J069 Acute upper respiratory infection, unspecified: Secondary | ICD-10-CM

## 2012-05-26 DIAGNOSIS — J309 Allergic rhinitis, unspecified: Secondary | ICD-10-CM

## 2012-05-26 MED ORDER — AZITHROMYCIN 250 MG PO TABS: ORAL_TABLET | ORAL | Status: AC

## 2012-05-26 MED ORDER — RANITIDINE HCL 300 MG PO TABS: 300.0000 mg | ORAL_TABLET | Freq: Every day | ORAL | Status: DC

## 2012-05-26 MED ORDER — PREDNISONE 20 MG PO TABS: ORAL_TABLET | ORAL | Status: DC

## 2012-05-26 NOTE — Progress Notes (Signed)
  Subjective:    Patient ID: Carrie Romero, female    DOB: [birth year], 41 y.o.   MRN: [id number]  HPI 41 yr old AAF presents w/ itchy, watery eyes.  Last time this happened prednisone was the only thing that helped. She recently travelled to [place].  Her eyes have been symptomatic for 5 days.  She developed a cough 3 days ago.  No f/c. Eyes not painful. +increased lacrimation, but no discharge.  Awakening with eyes being crusted.  Only using flonase occasionally. She started her optivar drops again the day her eys flared.  She takes zyrtec daily.  Her ophthalmologist is out of town.   Review of Systems  All other systems reviewed and are negative.       Objective:   Physical Exam  Nursing note and vitals reviewed. Constitutional: She is oriented to person, place, and time. She appears well-developed and well-nourished.  HENT:  Head: Normocephalic and atraumatic.  Right Ear: External ear normal.  Left Ear: External ear normal.  Mouth/Throat: No oropharyngeal exudate.       B TM with fluid, no infection. Turbinates swollen, pale, and boggy.  Eyes: EOM are normal. Pupils are equal, round, and reactive to light. Right eye exhibits no discharge. Left eye exhibits no discharge. No scleral icterus.       B eyes with erythema of bulbar and palpebral conjunctiva R>L. mild Chemosis present. Fundi benign  Cardiovascular: Normal rate, regular rhythm and normal heart sounds.   Pulmonary/Chest: Effort normal and breath sounds normal. She has no wheezes. She has no rales.  Neurological: She is alert and oriented to person, place, and time.  Skin: Skin is warm and dry.  Psychiatric: She has a normal mood and affect. Her behavior is normal.      Assessment & Plan:  URI believe mixed with allergy flare-continue optivar.  Restart zantac.  Add benadryl at night if needed. Start prednisone.  Hold antibiotics.  If infection ensues, zpack. If not improving in 2-3days RTC, sooner if worse.

## 2012-06-07 ENCOUNTER — Ambulatory Visit: Payer: 59

## 2012-06-07 ENCOUNTER — Ambulatory Visit (INDEPENDENT_AMBULATORY_CARE_PROVIDER_SITE_OTHER): Payer: 59 | Admitting: Family Medicine

## 2012-06-07 VITALS — BP 123/77 | HR 75 | Temp 98.0°F | Resp 16 | Ht 61.25 in | Wt 173.0 lb

## 2012-06-07 DIAGNOSIS — T148XXA Other injury of unspecified body region, initial encounter: Secondary | ICD-10-CM

## 2012-06-07 DIAGNOSIS — M25559 Pain in unspecified hip: Secondary | ICD-10-CM

## 2012-06-07 DIAGNOSIS — M79609 Pain in unspecified limb: Secondary | ICD-10-CM

## 2012-06-07 DIAGNOSIS — IMO0001 Reserved for inherently not codable concepts without codable children: Secondary | ICD-10-CM

## 2012-06-07 DIAGNOSIS — M79606 Pain in leg, unspecified: Secondary | ICD-10-CM

## 2012-06-07 NOTE — Progress Notes (Signed)
[facility]:  Office Visit  Chief Complaint:  Chief Complaint  Patient presents with  . Leg Pain    right, x 9 days    HPI: Carrie Romero is a 41 y.o. female who complains of  Right hip, groin intermittent throbbing pain radiating to anterior thigh. NKI. No prior surgeries.  Has tried Ibuprofen without relief. Works out regular but was not Pharmacologist out for 1 month in July due to broken toe on left foot. No history of arthritis or RA or osteoporosis/osteopenia. Bone pain constant, at night. Worse after standing and working. She states that during her 1 month recovery from the broken toe she was compensating with her left leg. Denies back pain, numbness, tingling. She exercise regular; treadmill, eliptical  Past Medical History  Diagnosis Date  . Migraine   . 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 Hyperlipidemia   . Sickle cell anemia     Trait only   Past Surgical History  Procedure Date  . Cesarean section 1996  . Cholecystectomy 1993  . Tubal ligation 1996  . Combined hysteroscopy diagnostic / d&c 05/2010  . Total lapr.hysterectomy 04/2011    DR.FONTAINE ASSISTANT DR.FERNANDEZ  . Myomectomy     2011  . Abdominal hysterectomy June 2012   History   Social History  . Marital Status: Single    Spouse Name: [name]    Number of Children: [name]  . Years of Education: [name]   Social History Main Topics  . Smoking status: Never Smoker   . Smokeless tobacco: Never Used  . Alcohol Use: Yes  . Drug Use: No  . Sexually Active: Yes    Birth Control/ Protection: Surgical   Other Topics Concern  . None   Social History Narrative  . None   Family History  Problem Relation Age of Onset  . Diabetes Mother   . Diabetes Father     pretty sure  . Cancer Sister     unsure  . Breast cancer Maternal Aunt   . Breast cancer Cousin    No Known Allergies Prior to Admission medications   Medication Sig Start Date End Date Taking? Authorizing Provider    azelastine (OPTIVAR) 0.05 % ophthalmic solution Place 1 drop into both eyes 2 (two) times daily. 02/02/12 02/01/13 Yes Marzella Schlein McClung, PA-C  CALCIUM PO Take by mouth. 1000MG    Yes Historical Provider, MD  cetirizine (ZYRTEC) 10 MG tablet Take 10 mg by mouth 2 (two) times daily.   Yes Historical Provider, MD  diphenhydrAMINE (SOMINEX) 25 MG tablet Take 25 mg by mouth at bedtime as needed.   Yes Historical Provider, MD  ferrous sulfate 325 (65 FE) MG tablet Take 325 mg by mouth daily. Qod    Yes Historical Provider, MD  fluticasone (FLONASE) 50 MCG/ACT nasal spray Place 2 sprays into the nose daily. 02/02/12 02/01/13 Yes Marzella Schlein McClung, PA-C  MAGNESIUM PO Take by mouth.   Yes Historical Provider, MD  POTASSIUM PO Take by mouth.   Yes Historical Provider, MD  ranitidine (ZANTAC) 300 MG tablet Take 1 tablet (300 mg total) by mouth at bedtime. 05/26/12 05/26/13 Yes Anders Simmonds, PA-C  SUMAtriptan (IMITREX) 100 MG tablet Take 100 mg by mouth every 2 (two) hours as needed.     Yes Historical Provider, MD  VITAMIN A PO Take by mouth.   Yes Historical Provider, MD  vitamin E 1000 UNIT capsule Take 1,000 Units by mouth daily.  Yes Historical Provider, MD  amoxicillin (AMOXIL) 500 MG capsule 2 tabs bid 02/02/12   Anders Simmonds, PA-C  predniSONE (DELTASONE) 20 MG tablet 4/4/3/3/2/2/1/1 single daily dose for 8 days 02/11/12   Tonye Pearson, MD  predniSONE (DELTASONE) 20 MG tablet 3,3,3,2,2,2,1,1,1 take each days dose in am with food 05/26/12   Anders Simmonds, PA-C     ROS: The patient denies fevers, chills, night sweats, unintentional weight loss, chest pain, palpitations, wheezing, dyspnea on exertion, nausea, vomiting, abdominal pain, dysuria, hematuria, melena, numbness, weakness, or tingling.   All other systems have been reviewed and were otherwise negative with the exception of those mentioned in the HPI and as above.    PHYSICAL EXAM: Filed Vitals:   06/07/12 1750  BP: 123/77  Pulse: 75   Temp: 98 F (36.7 C)  Resp: 16   Filed Vitals:   06/07/12 1750  Height: 5' 1.25" (1.556 m)  Weight: 173 lb (78.472 kg)   Body mass index is 32.42 kg/(m^2).  General: Alert, no acute distress HEENT:  Normocephalic, atraumatic, oropharynx patent.  Cardiovascular:  Regular rate and rhythm, no rubs murmurs or gallops.  No Carotid bruits, radial pulse intact. No pedal edema.  Respiratory: Clear to auscultation bilaterally.  No wheezes, rales, or rhonchi.  No cyanosis, no use of accessory musculature GI: No organomegaly, abdomen is soft and non-tender, positive bowel sounds.  No masses. Skin: No rashes. Neurologic: Facial musculature symmetric. Psychiatric: Patient is appropriate throughout our interaction. Lymphatic: No cervical lymphadenopathy Musculoskeletal: Gait intact. Lumbar spine nl Bilateral hips nl Right Leg: No atrophy, no warmth +tenderness along medial aspect of anterior thigh/inner groin where vastus medialis/adductor magnus is located + minimal weakness with adduction and straight leg test on right compared to left leg.  Sensation intact DTR 2/2 knee Patellar tracking equivical Neg Lachmans, McMurray No e/o quadricep rupture Hamstrings tight   LABS:    EKG/XRAY:   Primary read interpreted by [facility] Con[facility] Rolls at [facility]. No fx or dislocation of hip or tib/fib No abnormal osseus changes  ASSESSMENT/PLAN: Encounter Diagnoses  Name Primary?  . Hip pain Yes  . Leg pain    Strain/sprain of qaudriceps muscle ( vastus medailis). I do not think this is patellofemoral syndrome based on no e/o patellar tracking and anterior knee pain. She does have pain along vastus medialis and also adductor magnus based on history and exam. Also weak hamstrings on exam. Will rx Mobic to take as needed. Patient offered PT referral but would like to do home exercises first. IF no improvement then will consider PT referral.  ROM, RICE, f/u in 2-4 weeks if no improvement.     LE, [name],  DO 06/07/2012 6:08 PM

## 2012-06-28 ENCOUNTER — Ambulatory Visit (INDEPENDENT_AMBULATORY_CARE_PROVIDER_SITE_OTHER): Payer: 59 | Admitting: Physician Assistant

## 2012-06-28 ENCOUNTER — Encounter: Payer: Self-pay | Admitting: Physician Assistant

## 2012-06-28 VITALS — BP 114/62 | HR 71 | Temp 98.7°F | Resp 16 | Ht 61.4 in | Wt 161.4 lb

## 2012-06-28 DIAGNOSIS — Z23 Encounter for immunization: Secondary | ICD-10-CM

## 2012-06-28 DIAGNOSIS — R635 Abnormal weight gain: Secondary | ICD-10-CM

## 2012-06-28 DIAGNOSIS — E785 Hyperlipidemia, unspecified: Secondary | ICD-10-CM

## 2012-06-28 DIAGNOSIS — F419 Anxiety disorder, unspecified: Secondary | ICD-10-CM

## 2012-06-28 DIAGNOSIS — E8881 Metabolic syndrome: Secondary | ICD-10-CM

## 2012-06-28 DIAGNOSIS — D649 Anemia, unspecified: Secondary | ICD-10-CM

## 2012-06-28 DIAGNOSIS — G4700 Insomnia, unspecified: Secondary | ICD-10-CM

## 2012-06-28 DIAGNOSIS — Z0000 Encounter for general adult medical examination without abnormal findings: Secondary | ICD-10-CM

## 2012-06-28 LAB — COMPREHENSIVE METABOLIC PANEL
ALT: 19 U/L (ref 0–35)
Albumin: 4.2 g/dL (ref 3.5–5.2)
CO2: 25 mEq/L (ref 19–32)
Calcium: 9.2 mg/dL (ref 8.4–10.5)
Chloride: 107 mEq/L (ref 96–112)
Glucose, Bld: 87 mg/dL (ref 70–99)
Potassium: 4 mEq/L (ref 3.5–5.3)
Sodium: 139 mEq/L (ref 135–145)
Total Protein: 7.3 g/dL (ref 6.0–8.3)

## 2012-06-28 LAB — FOLLICLE STIMULATING HORMONE: FSH: 17.4 m[IU]/mL

## 2012-06-28 LAB — POCT URINALYSIS DIPSTICK
Bilirubin, UA: NEGATIVE
Blood, UA: NEGATIVE
Nitrite, UA: NEGATIVE
Protein, UA: NEGATIVE
pH, UA: 6

## 2012-06-28 LAB — CBC WITH DIFFERENTIAL/PLATELET
Hemoglobin: 12.3 g/dL (ref 12.0–15.0)
Lymphocytes Relative: 43 % (ref 12–46)
Lymphs Abs: 1.8 10*3/uL (ref 0.7–4.0)
Monocytes Relative: 10 % (ref 3–12)
Neutrophils Relative %: 46 % (ref 43–77)
Platelets: 388 10*3/uL (ref 150–400)
RBC: 4.28 MIL/uL (ref 3.87–5.11)
WBC: 4.1 10*3/uL (ref 4.0–10.5)

## 2012-06-28 LAB — TSH: TSH: 1.651 u[IU]/mL (ref 0.350–4.500)

## 2012-06-28 LAB — LUTEINIZING HORMONE: LH: 6.1 m[IU]/mL

## 2012-06-28 LAB — LIPID PANEL: Total CHOL/HDL Ratio: 5 Ratio

## 2012-06-28 LAB — PROLACTIN: Prolactin: 6.4 ng/mL

## 2012-06-28 MED ORDER — AMITRIPTYLINE HCL 50 MG PO TABS: 50.0000 mg | ORAL_TABLET | Freq: Every day | ORAL | Status: DC

## 2012-06-28 MED ORDER — ALPRAZOLAM 0.5 MG PO TABS: 0.5000 mg | ORAL_TABLET | Freq: Two times a day (BID) | ORAL | Status: AC

## 2012-06-29 ENCOUNTER — Encounter: Payer: Self-pay | Admitting: Physician Assistant

## 2012-06-29 LAB — INSULIN, FASTING: Insulin fasting, serum: 17 u[IU]/mL (ref 3–28)

## 2012-06-29 LAB — VITAMIN D 25 HYDROXY (VIT D DEFICIENCY, FRACTURES): Vit D, 25-Hydroxy: 26 ng/mL — ABNORMAL LOW (ref 30–89)

## 2012-06-29 NOTE — Progress Notes (Signed)
  Subjective:    Patient ID: Carrie Romero, female    DOB: [birth year], 41 y.o.   MRN: [id number]  HPI 41 yr old AAF presents for CPE.  She is having trouble sleeping.  Exercises ~5 times/week. 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  Feels she def does more than her share of staying late at work.  She has difficulty saying no.  In addition she is a single mom and has 2 daughters and one son.  So, she has multiple, demanding stresses. She took xanax over 10 years ago and it worked really well.  She just didn't feel comfortable staying on it long term.  She took it about 2 years.  Review of Systems  All other systems reviewed and are negative.       Objective:   Physical Exam  Nursing note and vitals reviewed. Constitutional: She is oriented to person, place, and time. She appears well-developed and well-nourished.  HENT:  Head: Normocephalic and atraumatic.  Right Ear: External ear normal.  Left Ear: External ear normal.  Mouth/Throat: Oropharynx is clear and moist. No oropharyngeal exudate.  Eyes: Conjunctivae and EOM are normal. Pupils are equal, round, and reactive to light.  Neck: Normal range of motion. Neck supple. No thyromegaly present.  Cardiovascular: Normal rate, regular rhythm, normal heart sounds and intact distal pulses.  Exam reveals no gallop and no friction rub.   No murmur heard. Pulmonary/Chest: Effort normal and breath sounds normal. Right breast exhibits no inverted nipple, no mass, no nipple discharge, no skin change and no tenderness. Left breast exhibits no inverted nipple, no mass, no nipple discharge, no skin change and no tenderness. Breasts are symmetrical.  Abdominal: Soft. Bowel sounds are normal.  Lymphadenopathy:    She has no cervical adenopathy.    She has no axillary adenopathy.       Right: No supraclavicular adenopathy present.       Left: No supraclavicular adenopathy present.  Neurological: She is alert  and oriented to person, place, and time.       Assessment & Plan:  CPE-normal exam Anxiety and Insomnia.  start elavil.  Use xanax only sparingly.  I will see her in the end of September and we can reassess.  We will consider a sleep study.

## 2012-07-06 ENCOUNTER — Telehealth: Payer: Self-pay

## 2012-07-06 MED ORDER — ERGOCALCIFEROL 1.25 MG (50000 UT) PO CAPS: 50000.0000 [IU] | ORAL_CAPSULE | ORAL | Status: DC

## 2012-07-06 NOTE — Telephone Encounter (Signed)
Pt addressed by Delaney Meigs in lab notes.

## 2012-07-06 NOTE — Telephone Encounter (Signed)
Pt calling about her lab results.

## 2012-07-06 NOTE — Addendum Note (Signed)
Addended by: Anders Simmonds on: 07/06/2012 03:00 PM   Modules accepted: Orders

## 2012-07-26 ENCOUNTER — Ambulatory Visit: Payer: 59 | Admitting: Physician Assistant

## 2012-08-02 ENCOUNTER — Ambulatory Visit (INDEPENDENT_AMBULATORY_CARE_PROVIDER_SITE_OTHER): Payer: 59 | Admitting: Physician Assistant

## 2012-08-02 ENCOUNTER — Encounter: Payer: Self-pay | Admitting: Physician Assistant

## 2012-08-02 VITALS — BP 104/64 | HR 88 | Temp 98.9°F | Resp 16 | Ht 61.5 in | Wt 173.4 lb

## 2012-08-02 DIAGNOSIS — N898 Other specified noninflammatory disorders of vagina: Secondary | ICD-10-CM

## 2012-08-02 DIAGNOSIS — F419 Anxiety disorder, unspecified: Secondary | ICD-10-CM

## 2012-08-02 DIAGNOSIS — B373 Candidiasis of vulva and vagina: Secondary | ICD-10-CM

## 2012-08-02 DIAGNOSIS — F411 Generalized anxiety disorder: Secondary | ICD-10-CM

## 2012-08-02 LAB — POCT WET PREP WITH KOH
RBC Wet Prep HPF POC: NEGATIVE
Yeast Wet Prep HPF POC: NEGATIVE

## 2012-08-02 MED ORDER — FLUCONAZOLE 150 MG PO TABS: 150.0000 mg | ORAL_TABLET | Freq: Once | ORAL | Status: DC

## 2012-08-02 MED ORDER — AMITRIPTYLINE HCL 50 MG PO TABS: 100.0000 mg | ORAL_TABLET | Freq: Every day | ORAL | Status: DC

## 2012-08-02 NOTE — Progress Notes (Signed)
  Subjective:    Patient ID: Carrie Romero, female    DOB: 11-Jul-1971, 41 y.o.   MRN: [id number]  HPI here for recheck on anxiety and insomnia. She is sleeping better since starting the elavil, but she found she would only sleep about 4 hours, so she has been taking a xanax with it in order to sleep longer.  This has given her 6-8 hours of sleep.  She has been taking a xanax at about noon almost daily which has been really helping with her anxiety.  She is unsure if the the elavil has improved her overall anxiety.  She is tolerating both without any adverse effects.  Also c/o several days of vaginal itching and slight discharge.  No pelvic pain. No recent antibiotics.  Review of Systems  All other systems reviewed and are negative.       Objective:   Physical Exam  Nursing note and vitals reviewed. Constitutional: She is oriented to person, place, and time. She appears well-developed and well-nourished.  Cardiovascular: Normal rate.   Pulmonary/Chest: Effort normal.  Neurological: She is alert and oriented to person, place, and time.  Psychiatric: She has a normal mood and affect. Her behavior is normal. Judgment and thought content normal.   Results for orders placed in visit on 08/02/12  POCT WET PREP WITH KOH      Component Value Range   Trichomonas, UA Negative     Clue Cells Wet Prep HPF POC neg     Epithelial Wet Prep HPF POC 0-10     Yeast Wet Prep HPF POC neg     Bacteria Wet Prep HPF POC 1+     RBC Wet Prep HPF POC neg     WBC Wet Prep HPF POC 0-2     KOH Prep POC Positive           Assessment & Plan:  Insomnia-improving on elavil.  Increase dose to 100mg  hs. Hopefully if this is a more therapeutic dose, she will not need to take the xanax at night to help with sleep and that she will also be able to decrease the frequency of needing to take it in the day time. Ideally, I would like her to take xanax only prn sparingly due to dependence properties and use the elavil  or something similar to decrease her overall anxiety. She agrees. OK to fill Xanax up to every month to 6 weeks. I did not give her a new prescription of xanax today bc she still has several and will call for RF. Yeast vaginitis-diflucan sent

## 2012-08-08 ENCOUNTER — Telehealth: Payer: Self-pay

## 2012-08-08 MED ORDER — FLUCONAZOLE 150 MG PO TABS: 150.0000 mg | ORAL_TABLET | Freq: Once | ORAL | Status: DC

## 2012-08-08 NOTE — Telephone Encounter (Signed)
Refill done and sent in. 

## 2012-08-08 NOTE — Telephone Encounter (Signed)
SAW MCCLUNG LAST Tuesday FOR YEAST INFECTION.  WAS TOLD IF IT DIDN'T CLEAR UP TO CALL BACK.  SHE SAYS IT HASN'T COMPLETELY GONE AWAY.  USES [facility].  CALL [phone]

## 2012-08-09 NOTE — Telephone Encounter (Signed)
Informed pt that rx was sent in

## 2012-08-29 ENCOUNTER — Telehealth: Payer: Self-pay

## 2012-08-29 MED ORDER — AMITRIPTYLINE HCL 100 MG PO TABS: 100.0000 mg | ORAL_TABLET | Freq: Every day | ORAL | Status: DC

## 2012-08-29 MED ORDER — ALPRAZOLAM 0.5 MG PO TABS: 0.5000 mg | ORAL_TABLET | Freq: Two times a day (BID) | ORAL | Status: DC | PRN

## 2012-08-29 NOTE — Telephone Encounter (Signed)
I misunderstood that patient was previously prescribed 50mg  tabs but new dose is 100mg .  Have changed her to the 100mg  tabs, take one PO QHS.

## 2012-08-29 NOTE — Telephone Encounter (Signed)
Please review again

## 2012-08-29 NOTE — Telephone Encounter (Signed)
Rx faxed, Elavil sent also.

## 2012-08-29 NOTE — Telephone Encounter (Signed)
The patient called to request refills of Elavil and Alprazolam be called into [facility].  Please call the patient when prescriptions called into pharmacy.  The best phone number for patient is [phone].

## 2012-08-29 NOTE — Telephone Encounter (Signed)
Xanax prescription at desk, too soon for Elavil

## 2012-08-29 NOTE — Telephone Encounter (Signed)
Insomnia-improving on elavil. Increase dose to 100mg  hs. Hopefully if this is a more therapeutic dose, she will not need to take the xanax at night to help with sleep and that she will also be able to decrease the frequency of needing to take it in the day time. Ideally, I would like her to take xanax only prn sparingly due to dependence properties and use the elavil or something similar to decrease her overall anxiety. She agrees.  OK to fill Xanax up to every month to 6 weeks. I did not give her a new prescription of xanax today bc she still has several and will call for RF.  From office visit 08/02/12 with Marylene Land, please advise on renewal, Rx pended she should have enough of the Elavil.

## 2012-09-01 ENCOUNTER — Other Ambulatory Visit: Payer: Self-pay | Admitting: Radiology

## 2012-09-01 NOTE — Telephone Encounter (Signed)
Called rx in to [facility], they could not accept our fax.

## 2012-10-22 IMAGING — CR DG HIP COMPLETE 2+V*R*
2 series · 2 of 2 positions shown · non-contrast
Comparison: None.

CLINICAL DATA: Hip pain

RIGHT HIP - COMPLETE 2+ VIEW

[AP]
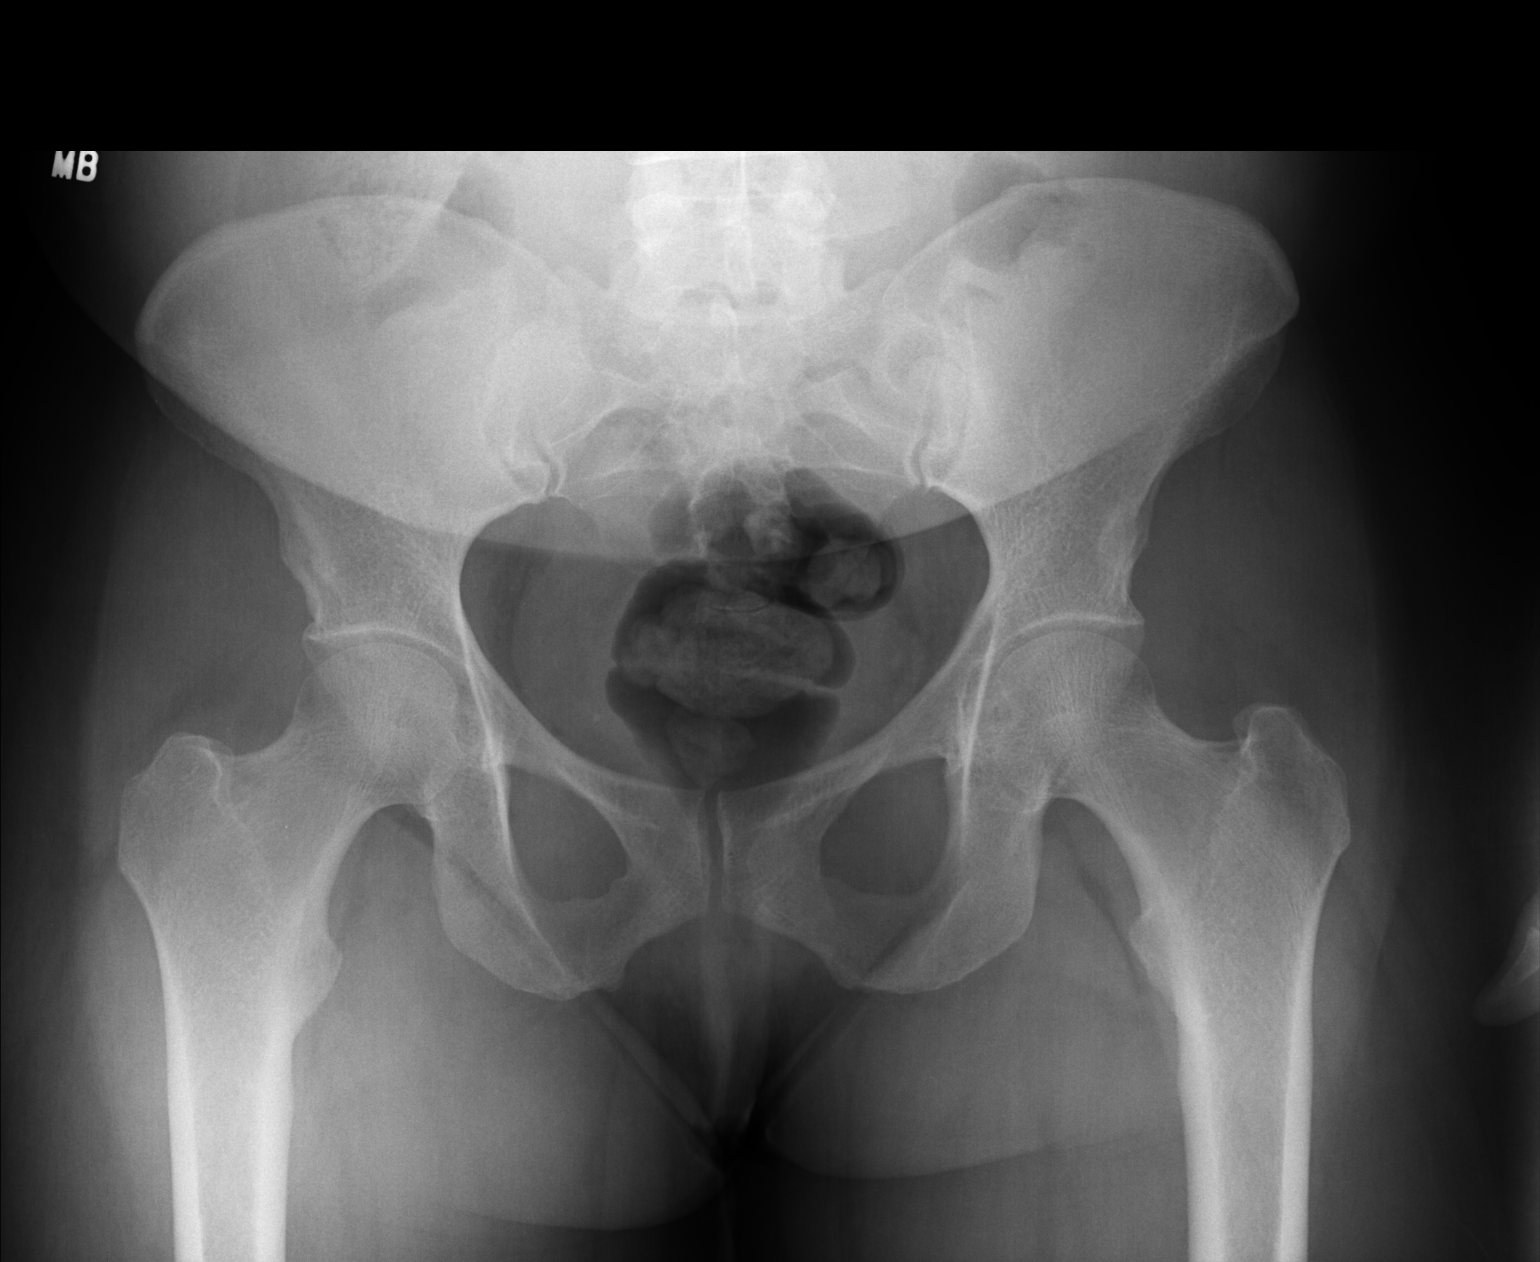

[lateral]
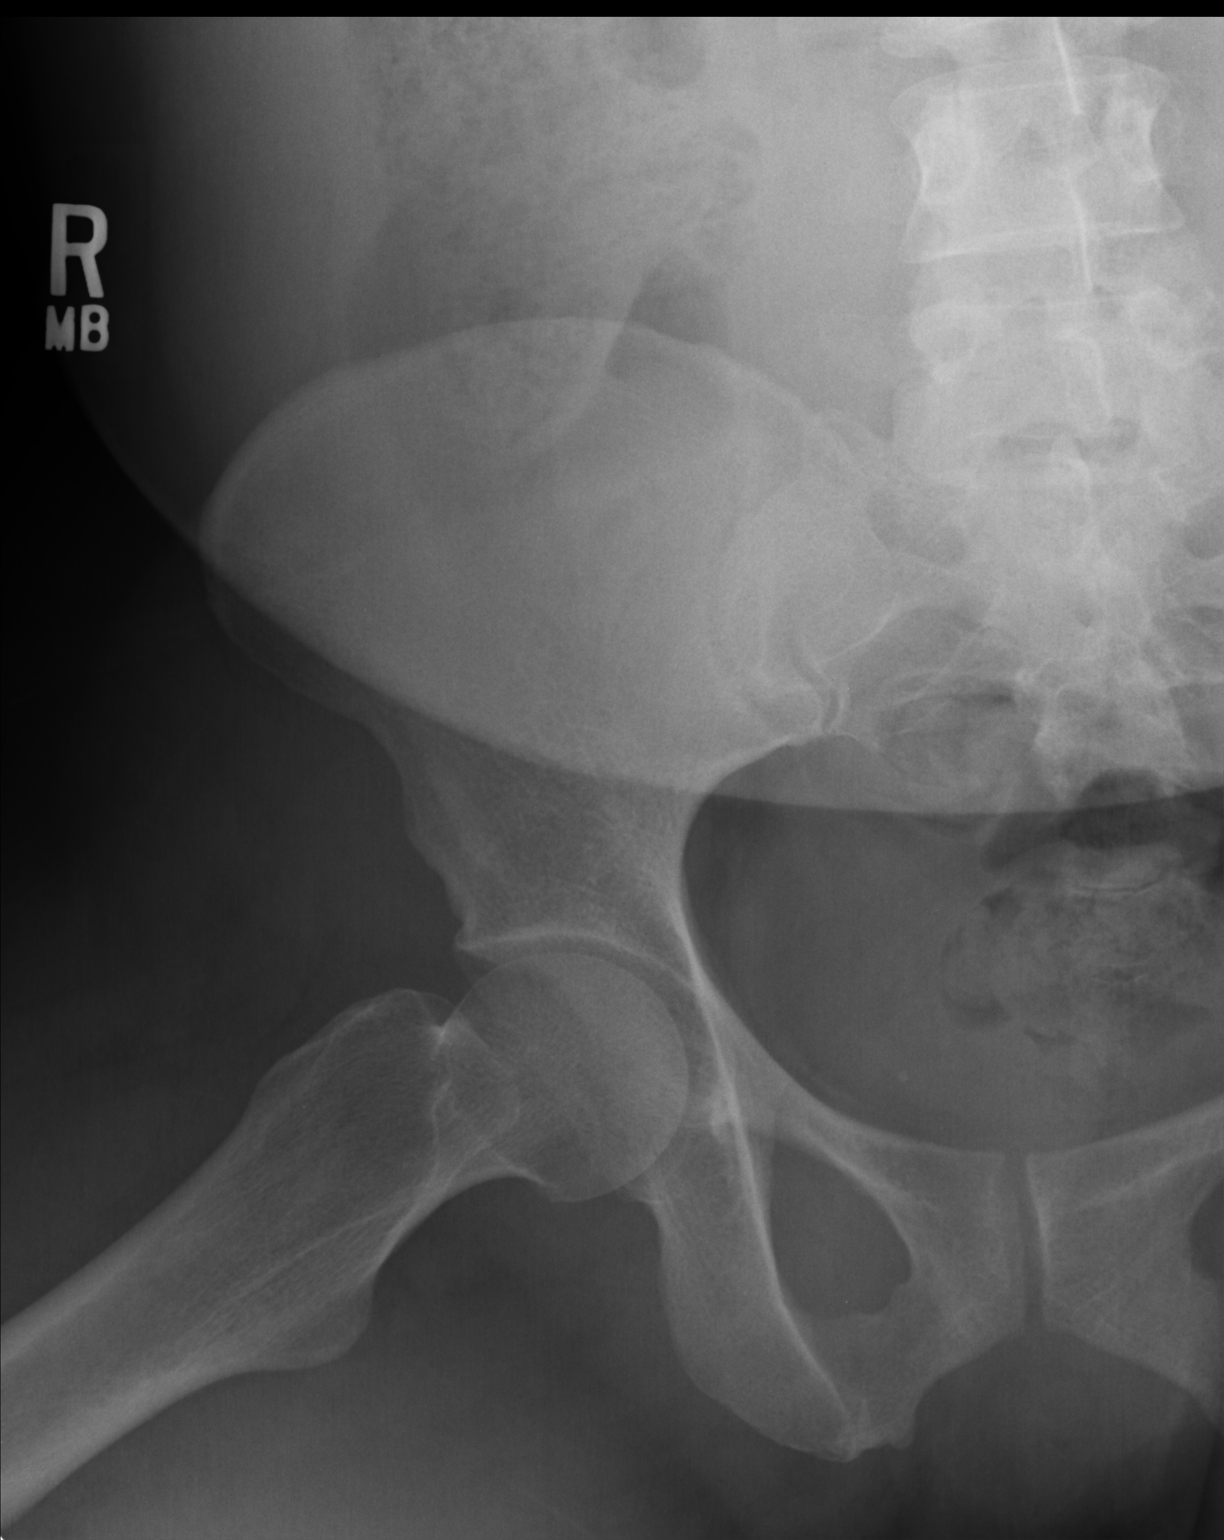

[2 of 2 positions shown; findings below may reference images not displayed]

FINDINGS: Intact right hip.  Normal alignment.  No fracture.  Hips
are symmetric.  Pelvis intact.  Normal SI joints and no diastasis.
IMPRESSION: No acute finding

Clinically significant discrepancy from primary report, if
provided: None

## 2012-11-13 ENCOUNTER — Other Ambulatory Visit: Payer: Self-pay | Admitting: Gynecology

## 2012-11-13 ENCOUNTER — Telehealth: Payer: Self-pay | Admitting: *Deleted

## 2012-11-13 ENCOUNTER — Other Ambulatory Visit: Payer: Self-pay | Admitting: Physician Assistant

## 2012-11-13 DIAGNOSIS — Z01419 Encounter for gynecological examination (general) (routine) without abnormal findings: Secondary | ICD-10-CM

## 2012-11-13 DIAGNOSIS — Z1231 Encounter for screening mammogram for malignant neoplasm of breast: Secondary | ICD-10-CM

## 2012-11-13 NOTE — Telephone Encounter (Signed)
Okay for CBC, lipid profile, comprehensive metabolic panel and urinalysis

## 2012-11-13 NOTE — Telephone Encounter (Signed)
Orders placed.

## 2012-11-13 NOTE — Telephone Encounter (Signed)
Pt has annual scheduled on 01/31/13, she would like to have labs drawn prior to annual. Okay to place lab orders? Please advise

## 2012-11-21 ENCOUNTER — Other Ambulatory Visit: Payer: Self-pay | Admitting: Physician Assistant

## 2012-11-22 ENCOUNTER — Telehealth: Payer: Self-pay

## 2012-11-22 NOTE — Telephone Encounter (Signed)
[facility] does not have this order, please resend

## 2012-11-22 NOTE — Telephone Encounter (Signed)
Sent to [facility]. 

## 2012-11-22 NOTE — Telephone Encounter (Signed)
PT STATES THAT HER PHARMACY HAS STILL NOT RECEIVED A RESPONSE FROM [place] REGARDING REFILLING HER APRAZELAM. BEST# [phone]

## 2012-12-07 ENCOUNTER — Other Ambulatory Visit: Payer: 59

## 2012-12-12 ENCOUNTER — Encounter: Payer: 59 | Admitting: Gynecology

## 2012-12-18 ENCOUNTER — Ambulatory Visit
Admission: RE | Admit: 2012-12-18 | Discharge: 2012-12-18 | Disposition: A | Discharge status: Home / Self Care | Payer: 59 | Source: Ambulatory Visit | Attending: Gynecology | Admitting: Gynecology

## 2012-12-18 DIAGNOSIS — Z1231 Encounter for screening mammogram for malignant neoplasm of breast: Secondary | ICD-10-CM

## 2013-01-09 ENCOUNTER — Other Ambulatory Visit: Payer: Self-pay | Admitting: Physician Assistant

## 2013-01-23 ENCOUNTER — Other Ambulatory Visit: Payer: Self-pay | Admitting: Gynecology

## 2013-01-23 ENCOUNTER — Other Ambulatory Visit: Payer: 59

## 2013-01-23 DIAGNOSIS — Z01419 Encounter for gynecological examination (general) (routine) without abnormal findings: Secondary | ICD-10-CM

## 2013-01-23 DIAGNOSIS — E559 Vitamin D deficiency, unspecified: Secondary | ICD-10-CM

## 2013-01-23 LAB — CBC
Platelets: 341 10*3/uL (ref 150–400)
RBC: 4.2 MIL/uL (ref 3.87–5.11)
RDW: 14.1 % (ref 11.5–15.5)
WBC: 4.5 10*3/uL (ref 4.0–10.5)

## 2013-01-23 LAB — COMPREHENSIVE METABOLIC PANEL
Alkaline Phosphatase: 63 U/L (ref 39–117)
BUN: 8 mg/dL (ref 6–23)
CO2: 25 mEq/L (ref 19–32)
Glucose, Bld: 88 mg/dL (ref 70–99)
Sodium: 137 mEq/L (ref 135–145)
Total Bilirubin: 0.5 mg/dL (ref 0.3–1.2)
Total Protein: 7.6 g/dL (ref 6.0–8.3)

## 2013-01-23 LAB — LIPID PANEL
Cholesterol: 240 mg/dL — ABNORMAL HIGH (ref 0–200)
HDL: 53 mg/dL (ref >39)
Total CHOL/HDL Ratio: 4.5 Ratio
Triglycerides: 91 mg/dL (ref <150)

## 2013-01-24 LAB — URINALYSIS W MICROSCOPIC + REFLEX CULTURE
Crystals: NONE SEEN
Glucose, UA: NEGATIVE mg/dL
Leukocytes, UA: NEGATIVE
Protein, ur: NEGATIVE mg/dL
Specific Gravity, Urine: <1.005 — ABNORMAL LOW (ref 1.005–1.030)
Squamous Epithelial / LPF: NONE SEEN
Urobilinogen, UA: 0.2 mg/dL (ref 0.0–1.0)

## 2013-01-25 ENCOUNTER — Other Ambulatory Visit: Payer: Self-pay | Admitting: *Deleted

## 2013-01-25 DIAGNOSIS — D649 Anemia, unspecified: Secondary | ICD-10-CM

## 2013-01-26 ENCOUNTER — Telehealth: Payer: Self-pay

## 2013-01-26 MED ORDER — AMITRIPTYLINE HCL 100 MG PO TABS: 100.0000 mg | ORAL_TABLET | Freq: Every day | ORAL | Status: DC

## 2013-01-26 MED ORDER — ALPRAZOLAM 0.5 MG PO TABS: 0.5000 mg | ORAL_TABLET | Freq: Two times a day (BID) | ORAL | Status: DC | PRN

## 2013-01-26 NOTE — Telephone Encounter (Signed)
Pt had a f-up appt with Marylene Land scheduled for 4/2, we just we advised today that Marylene Land will not be having this clinic. Pt was coming in for her 6 month med check, needs refill on alprazolam and amitriptyline. Pt has taken this day off of work so she cannot reschedule anytime soon. The other providers on this day have full clinics.  Could we supply refills on these [facility]) until I can get her an appt to establish care with another provider?  Please respond to [place] @ [facility] and I will handle with pt.  Thanks

## 2013-01-26 NOTE — Telephone Encounter (Signed)
Pt informed of refill and appt scheduled for 02/14/13 with Dr. Audria Nine.

## 2013-01-26 NOTE — Telephone Encounter (Signed)
Medications refilled

## 2013-01-26 NOTE — Telephone Encounter (Signed)
To you FYI  

## 2013-01-31 ENCOUNTER — Ambulatory Visit (INDEPENDENT_AMBULATORY_CARE_PROVIDER_SITE_OTHER): Payer: 59 | Admitting: Gynecology

## 2013-01-31 ENCOUNTER — Ambulatory Visit: Payer: 59 | Admitting: Physician Assistant

## 2013-01-31 ENCOUNTER — Encounter: Payer: Self-pay | Admitting: Gynecology

## 2013-01-31 VITALS — BP 124/80 | Ht 61.0 in | Wt 173.0 lb

## 2013-01-31 DIAGNOSIS — Z01419 Encounter for gynecological examination (general) (routine) without abnormal findings: Secondary | ICD-10-CM

## 2013-01-31 NOTE — Patient Instructions (Signed)
Follow up in one year for annual exam 

## 2013-01-31 NOTE — Progress Notes (Signed)
Carrie Romero 05-Jan-1971 [id number]        42 y.o.  [id number] for annual exam.  Doing well without complaints.  Past medical history,surgical history, medications, allergies, family history and social history were all reviewed and documented in the EPIC chart. ROS:  Was performed and pertinent positives and negatives are included in the history.  Exam: Carrie Romero assistant Filed Vitals:   01/31/13 0835  BP: 124/80  Height: 5\' 1"  (1.549 m)  Weight: 173 lb (78.472 kg)   General appearance  Normal Skin grossly normal Head/Neck normal with no cervical or supraclavicular adenopathy thyroid normal Lungs  clear Cardiac RR, without RMG Abdominal  soft, nontender, without masses, organomegaly or hernia Breasts  examined lying and sitting without masses, retractions, discharge or axillary adenopathy. Pelvic  Ext/BUS/vagina  normal    Adnexa  Without masses or tenderness    Anus and perineum  normal   Rectovaginal  normal sphincter tone without palpated masses or tenderness.    Assessment/Plan:  42 y.o. [id number] female for annual exam.   1. Status post TLH for leiomyoma/adenomyosis. Doing well without complaints. 2. Pap smear 2013. No Pap smear done today. History of LGSIL 2011. Hysterectomy 2012 with pathology showing cervix normal, no residual dysplasia. Pap smear last year normal. Discussed options to stop screen altogether versus less frequent screening intervals. Given the history of dysplasia in 2011 I recommend that we rePap her at less frequent intervals at least for now. We'll plan Pap in the next year or 2 and we'll rediscuss on annual basis. 3. Mammography 12/2012. Continue annual mammography. SBE monthly reviewed. 4. Health maintenance. Had lab work done recently which showed elevated cholesterol and LDL. She admits to not exercising and eating poorly. Has resumed exercise and diet control. Has appointment to see her primary physician in several weeks and will follow up with them in  reference to this. Also had marginal vitamin D of 27 of a recommended supplement 1000 units daily OTC. Mild anemia at 11.7, does have sickle trait and I suspect that she will have a marginal hemoglobin regardless. Followup in one year, sooner as needed.   Dara Lords MD, 9:01 AM 01/31/2013

## 2013-02-14 ENCOUNTER — Ambulatory Visit (INDEPENDENT_AMBULATORY_CARE_PROVIDER_SITE_OTHER): Payer: 59 | Admitting: Family Medicine

## 2013-02-14 ENCOUNTER — Encounter: Payer: Self-pay | Admitting: Family Medicine

## 2013-02-14 ENCOUNTER — Telehealth: Payer: Self-pay

## 2013-02-14 VITALS — BP 116/66 | HR 83 | Temp 98.0°F | Resp 16 | Ht 62.0 in | Wt 170.0 lb

## 2013-02-14 DIAGNOSIS — B009 Herpesviral infection, unspecified: Secondary | ICD-10-CM

## 2013-02-14 DIAGNOSIS — F341 Dysthymic disorder: Secondary | ICD-10-CM

## 2013-02-14 DIAGNOSIS — Z569 Unspecified problems related to employment: Secondary | ICD-10-CM

## 2013-02-14 DIAGNOSIS — F418 Other specified anxiety disorders: Secondary | ICD-10-CM | POA: Insufficient documentation

## 2013-02-14 DIAGNOSIS — Z566 Other physical and mental strain related to work: Secondary | ICD-10-CM

## 2013-02-14 MED ORDER — ALPRAZOLAM 0.5 MG PO TABS: 0.5000 mg | ORAL_TABLET | Freq: Two times a day (BID) | ORAL | Status: DC | PRN

## 2013-02-14 MED ORDER — VALACYCLOVIR HCL 1 G PO TABS: 1000.0000 mg | ORAL_TABLET | Freq: Two times a day (BID) | ORAL | Status: DC

## 2013-02-14 MED ORDER — AMITRIPTYLINE HCL 100 MG PO TABS: 100.0000 mg | ORAL_TABLET | Freq: Every day | ORAL | Status: DC

## 2013-02-14 MED ORDER — VALACYCLOVIR HCL 1 G PO TABS: ORAL_TABLET | ORAL | Status: DC

## 2013-02-14 NOTE — Telephone Encounter (Signed)
Dr. Audria Nine is gone for the day, can someone else assist ?

## 2013-02-14 NOTE — Telephone Encounter (Signed)
Pt seen today by Dr. Audria Nine. She sent and rx for Valtrex to [facility] pharmacy but they cannot fill as they do not understand the instructions. Pt would like to pickup rx today if possiblt

## 2013-02-14 NOTE — Telephone Encounter (Signed)
Dr. Audria Nine can you please clarify the rx for Valtrex.  Please print out and patient will pick up prescription today.,

## 2013-02-14 NOTE — Telephone Encounter (Signed)
Pharmacy was asking to clarify if Valtrex is one daily or twice daily. Called to clarify this is indeed for once daily. Have double checked this with Carrie Oar PA

## 2013-02-15 DIAGNOSIS — B009 Herpesviral infection, unspecified: Secondary | ICD-10-CM | POA: Insufficient documentation

## 2013-02-15 NOTE — Progress Notes (Signed)
S:  This 42 y.o. 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 Amitriptyline was prescribed by another provider and this has been helpful. Initially, she has some AM grogginess but this has subsided. 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 She has been referred to "communication skills" classes by her supervisor. Interpersonal relationships on the job are strained but she is trying to adjust her approach to  tese stressors. She does takes Alprazolam as needed.   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 She would like to have a medication to take daily; this was offered at previous visit but pt declined. Last outbreak lasted 2 months.  ROS: As per HPI. Negative for decreased appetite, excessive fatigue, abnormal weight change, CP or tightness, palpitations, SOB or DOE, GI problems, HA, dizziness or weakness. She denies SI/HI, agitation or behavior changes.  O: Filed Vitals:   02/14/13 1007  BP: 116/66  Pulse: 83  Temp: 98 F (36.7 C)  Resp: 16   GEN: In NAD; WN,WD. HENT: [place]/AT; EOMMI w/ clear conj/ sclerae. EACs normal. Oroph clear and moist. COR: RRR. LUNGS: Normal resp rate and effort. SKIN: W&D. No rashes or erythema. NEURO: A&Ox 3; CNs intact. Mentation- Calm demeanor w/ flat affect; tearful. Good eye contact. Pt is appropriately conversant w normal thought content and judgement.  A/P: Depression with anxiety- stable; spent more than 50% of visit in counseling w/ pt. Refill medications.  HSV-2 infection- RX: Valacyclovir 1000 mg 1 tablet daily.  Work-related stress- pt will develop communication skills and better coping strategies.  Meds ordered this encounter  Medications  . DISCONTD: valACYclovir (VALTREX) 1000 MG tablet    Sig: Take 1 tablet (1,000 mg total) by mouth 2 (two) times daily.    Dispense:  20 tablet    Refill:  0  . valACYclovir (VALTREX) 1000  MG tablet    Sig: Take 1 tablet daily.    Dispense:  30 tablet    Refill:  5  . amitriptyline (ELAVIL) 100 MG tablet    Sig: Take 1 tablet (100 mg total) by mouth at bedtime.    Dispense:  30 tablet    Refill:  5    Order Specific Question:  Supervising Provider    Answer:  Ethelda Chick [[id number]]  . ALPRAZolam (XANAX) 0.5 MG tablet    Sig: Take 1 tablet (0.5 mg total) by mouth 2 (two) times daily as needed for anxiety.    Dispense:  60 tablet    Refill:  1    Order Specific Question:  Supervising Provider    Answer:  Ethelda Chick [[id number]]

## 2013-05-16 ENCOUNTER — Encounter: Payer: Self-pay | Admitting: Family Medicine

## 2013-05-16 ENCOUNTER — Ambulatory Visit (INDEPENDENT_AMBULATORY_CARE_PROVIDER_SITE_OTHER): Payer: 59 | Admitting: Family Medicine

## 2013-05-16 ENCOUNTER — Other Ambulatory Visit: Payer: Self-pay | Admitting: Family Medicine

## 2013-05-16 VITALS — BP 125/70 | HR 100 | Temp 98.5°F | Resp 16 | Ht 62.5 in | Wt 170.0 lb

## 2013-05-16 DIAGNOSIS — Z760 Encounter for issue of repeat prescription: Secondary | ICD-10-CM

## 2013-05-16 DIAGNOSIS — F341 Dysthymic disorder: Secondary | ICD-10-CM

## 2013-05-16 DIAGNOSIS — F418 Other specified anxiety disorders: Secondary | ICD-10-CM

## 2013-05-16 MED ORDER — ALPRAZOLAM 0.5 MG PO TABS: 0.5000 mg | ORAL_TABLET | Freq: Two times a day (BID) | ORAL | Status: DC | PRN

## 2013-05-16 MED ORDER — AMITRIPTYLINE HCL 100 MG PO TABS: 100.0000 mg | ORAL_TABLET | Freq: Every day | ORAL | Status: DC

## 2013-05-16 MED ORDER — VALACYCLOVIR HCL 1 G PO TABS: ORAL_TABLET | ORAL | Status: DC

## 2013-05-16 NOTE — Progress Notes (Signed)
S:  This 42 y.o. AA female has chronic depression and anxiety (much of it attributed to job-related stressors). Unfortunately, she was recently fired; this was a cause for increased stress and despondency. 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 Pt did attend a communication skills workshop at work (prior to firing); she considers this an opportunity for growth and learning.  She is dressed today to go to the fitness center to resume exercise program to improve fitness. She is taking medications as prescribed and will pick up refills today. Since she is no longer employed w/ [employer], she needs printed RXs to take to pharmacy of choice when next refills are due. She takes Alprazolam as needed for anxiety and it helps w/ sleep; Amitriptyline is main sleep medication and antidepressant. Pt voices no thoughts of self harm, major sleep disturbance, anorexia, abnormal weight loss, confusion, agitation or SI/HI.  Pt takes Valacyclovir daily to prevent HSV outbreaks; no recent lesions with daily medication.  Patient Active Problem List   Diagnosis Date Noted  . 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 Depression with anxiety 02/14/2013  . Migraines 02/02/2012  . Sickle cell trait 02/02/2012   PMHx, Soc Hx and Fam Hx reviewed.  ROS: As per HPI; negative for fatigue, CP or tightness, palpitations, DOE or cough, GI problems, myalgias/arthralgias, HA, dizziness, weakness, paresthesias, intolerance of tem changes or syncope.  O: Filed Vitals:   05/16/13 0752  BP: 125/70  Pulse: 100  Temp: 98.5 F (36.9 C)  Resp: 16   GEN: In NAD: WN,WD. HENT: [place]/AT; EOMI w/ clear conj and sclerae. EACs/ nose/oroph unremarkable. COR: RRR. No edema. LUNGS: Normal resp rate and effort. SKIN: W&D; no rashes, bruising or erythema. No perioral lesions. MS: MAEs; no deformities or effusions. No muscle atrophy. NEURO: A&Ox 3; Cns intact. Gait is normal. Motor/sensory grossly intact. PSYCH:  Pleasant demeanor; calm and attentive. No lability or agitation. Speech pattern and thought content/ memory normal. Judgement sound.  A/P: Depression with anxiety-  Stable on current medications; refills printed for pt to take to pharmacy of choice. She will pick up last refill from [facility]. Encouraged pursuit of long term goals and finish degree. Manage stress with healthy lifestyle choices (regular exercise and good nutrition as well as improving sleep hygiene).  Issue of repeat prescriptions  Meds ordered this encounter  Medications  . DISCONTD: ALPRAZolam (XANAX) 0.5 MG tablet    Sig: Take 1 tablet (0.5 mg total) by mouth 2 (two) times daily as needed for anxiety.    Dispense:  60 tablet    Refill:  1    Do not fill before June 16, 2013.    Order Specific Question:      Answer:    . amitriptyline (ELAVIL) 100 MG tablet    Sig: Take 1 tablet (100 mg total) by mouth at bedtime.    Dispense:  30 tablet    Refill:  5    Do not fill before June 16, 2013.    Order Specific Question:      Answer:    . valACYclovir (VALTREX) 1000 MG tablet    Sig: Take 1 tablet daily.    Dispense:  30 tablet    Refill:  5

## 2013-08-24 ENCOUNTER — Telehealth: Payer: Self-pay

## 2013-08-24 ENCOUNTER — Other Ambulatory Visit: Payer: Self-pay | Admitting: Family Medicine

## 2013-08-24 MED ORDER — ALPRAZOLAM 0.5 MG PO TABS: 0.5000 mg | ORAL_TABLET | Freq: Two times a day (BID) | ORAL | Status: DC | PRN

## 2013-08-24 NOTE — Telephone Encounter (Signed)
Alprazolam phoned to [facility] today.

## 2013-08-24 NOTE — Telephone Encounter (Signed)
Xanax refill phoned to [facility]; please let pt know.

## 2013-08-24 NOTE — Telephone Encounter (Signed)
Called to advise.  

## 2013-08-24 NOTE — Telephone Encounter (Signed)
PT is calling to ask Dr Audria Nine if she could have a refill on her Xanax and could it be sent to [facility] And she wants to let her know that she still doesn't have a job and will try to have money by next month to come and see her Call back number is [phone]

## 2013-09-06 ENCOUNTER — Other Ambulatory Visit: Payer: Self-pay

## 2013-09-18 ENCOUNTER — Encounter: Payer: Self-pay | Admitting: Family Medicine

## 2013-09-18 ENCOUNTER — Ambulatory Visit: Payer: Self-pay | Admitting: Family Medicine

## 2013-09-18 VITALS — BP 124/78 | HR 98 | Temp 98.6°F | Resp 16 | Ht 62.0 in | Wt 166.8 lb

## 2013-09-18 DIAGNOSIS — F341 Dysthymic disorder: Secondary | ICD-10-CM

## 2013-09-18 DIAGNOSIS — F418 Other specified anxiety disorders: Secondary | ICD-10-CM

## 2013-09-18 DIAGNOSIS — Z760 Encounter for issue of repeat prescription: Secondary | ICD-10-CM

## 2013-09-18 MED ORDER — AMITRIPTYLINE HCL 100 MG PO TABS: 100.0000 mg | ORAL_TABLET | Freq: Every day | ORAL | Status: DC

## 2013-09-18 MED ORDER — VALACYCLOVIR HCL 1 G PO TABS: ORAL_TABLET | ORAL | Status: DC

## 2013-09-18 MED ORDER — ALPRAZOLAM 0.5 MG PO TABS: 0.5000 mg | ORAL_TABLET | Freq: Two times a day (BID) | ORAL | Status: DC | PRN

## 2013-09-18 NOTE — Progress Notes (Signed)
S:  This 42 y.o. AA female returns for follow-up re: anxiety and depression. She is doing well on current medication; Alprazolam is taken as prescribed (once a day on most days). Pt was fired from position in 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 Pt remains focused but is concerned about biology courses; she has a B average. She states she has been blessed and financial issues are minor. Her son is a Printmaker at [employer]; she is concerned about him having to work and attend school. His GPA dropped from 3.7 to 3.3 but, otherwise, he is adjusting well. Pt feels that her main issue moving forward is self-forgiveness (about how her actions and outspokenness led to her being fired). She is sleeping well and has no appetite change, agitation, behavior problems, confusion, concentration problem or thoughts of self-harm.  Patient Active Problem List   Diagnosis Date Noted  . 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 Depression with anxiety 02/14/2013  . Migraines 02/02/2012  . Sickle cell trait 02/02/2012   PMHx, Surg Hx, Soc Hx and Fam Hx reviewed. Medications reconciled.  ROS: As per HPI.  O: Filed Vitals:   09/18/13 1352  BP: 124/78  Pulse: 98  Temp: 98.6 F (37 C)  Resp: 16   GEN: In NAD; WN,WD. HENT: [place]/AT; EOMI w/ clear conj/sclerae. EACs/nose/oroph unremarkable. COR: RRR. LUNGS: Normal resp rae and effort. SKIN: W&D; intact w/o diaphoresis, lesions or redness.  NEURO: A&O x 3; CNs intact. Nonfocal. PSYCH: Pleasant and calm demeanor. Attentive and communicative w/ normal speech pattern and thought content. Judgement is sound.  A/P: Depression with anxiety- Stable and improved on current medications. Continue same.  Medication refill Meds ordered this encounter  Medications  . ALPRAZolam (XANAX) 0.5 MG tablet    Sig: Take 1 tablet (0.5 mg total) by mouth 2 (two) times daily as needed for  anxiety.    Dispense:  60 tablet    Refill:  1    Order Specific Question:  Supervising Provider    Answer:  Ethelda Chick [[id number]]  . amitriptyline (ELAVIL) 100 MG tablet    Sig: Take 1 tablet (100 mg total) by mouth at bedtime.    Dispense:  30 tablet    Refill:  5    Order Specific Question:  Supervising Provider    Answer:  Ethelda Chick [[id number]]  . valACYclovir (VALTREX) 1000 MG tablet    Sig: Take 1 tablet daily.    Dispense:  30 tablet    Refill:  5   RTC in 5 months for CPE/PAP and fasting labs.

## 2013-11-30 ENCOUNTER — Telehealth: Payer: Self-pay

## 2013-11-30 DIAGNOSIS — E559 Vitamin D deficiency, unspecified: Secondary | ICD-10-CM

## 2013-11-30 DIAGNOSIS — E7800 Pure hypercholesterolemia, unspecified: Secondary | ICD-10-CM

## 2013-11-30 DIAGNOSIS — D649 Anemia, unspecified: Secondary | ICD-10-CM

## 2013-11-30 DIAGNOSIS — Z862 Personal history of diseases of the blood and blood-forming organs and certain disorders involving the immune mechanism: Secondary | ICD-10-CM

## 2013-11-30 NOTE — Telephone Encounter (Signed)
Pt is scheduled for a CPE with Dr Leward Quan on 01/17/14 @ 10:45. However, pt is requesting "orders for Labs" to be done in advance. Pt wants the Dr to have lab results at the time of CPE to review. Please call pt to advise if we can honor this request

## 2013-12-01 NOTE — Telephone Encounter (Signed)
I will place future orders; advise pt to come in within the 1st weeks of March for fasting labs.

## 2013-12-02 NOTE — Telephone Encounter (Signed)
LMOM to CB-please advise as well that there might be an extra co pay and that she should come within 6 days of her OV in case we need to add on any tests.

## 2013-12-04 ENCOUNTER — Telehealth: Payer: Self-pay

## 2013-12-04 MED ORDER — ALPRAZOLAM 0.5 MG PO TABS: 0.5000 mg | ORAL_TABLET | Freq: Two times a day (BID) | ORAL | Status: DC | PRN

## 2013-12-04 NOTE — Telephone Encounter (Signed)
Patient advised of the Alprazolam refill

## 2013-12-04 NOTE — Telephone Encounter (Signed)
Patient called regarding a refill for xanax. She states she called [place] Friday to request a refill and has not heard anything back. Please return her call. Thank you.

## 2013-12-04 NOTE — Telephone Encounter (Signed)
Advise pt that medication refill has been called to [facility].

## 2014-01-17 ENCOUNTER — Ambulatory Visit: Payer: Self-pay | Admitting: Family Medicine

## 2014-01-17 ENCOUNTER — Encounter: Payer: Self-pay | Admitting: Family Medicine

## 2014-01-17 VITALS — BP 118/80 | HR 102 | Temp 98.4°F | Resp 16 | Ht 65.0 in | Wt 165.2 lb

## 2014-01-17 DIAGNOSIS — K5900 Constipation, unspecified: Secondary | ICD-10-CM

## 2014-01-17 DIAGNOSIS — Z113 Encounter for screening for infections with a predominantly sexual mode of transmission: Secondary | ICD-10-CM

## 2014-01-17 DIAGNOSIS — E7800 Pure hypercholesterolemia, unspecified: Secondary | ICD-10-CM

## 2014-01-17 DIAGNOSIS — Z0000 Encounter for general adult medical examination without abnormal findings: Secondary | ICD-10-CM

## 2014-01-17 LAB — LIPID PANEL
CHOL/HDL RATIO: 3.9 ratio
Cholesterol: 223 mg/dL — ABNORMAL HIGH (ref 0–200)
HDL: 57 mg/dL (ref >39)
LDL Cholesterol: 149 mg/dL — ABNORMAL HIGH (ref 0–99)
Triglycerides: 84 mg/dL (ref <150)
VLDL: 17 mg/dL (ref 0–40)

## 2014-01-17 LAB — COMPLETE METABOLIC PANEL WITH GFR
ALBUMIN: 4.1 g/dL (ref 3.5–5.2)
ALK PHOS: 64 U/L (ref 39–117)
ALT: 49 U/L — ABNORMAL HIGH (ref 0–35)
AST: 38 U/L — AB (ref 0–37)
BUN: 8 mg/dL (ref 6–23)
CO2: 27 mEq/L (ref 19–32)
Calcium: 9.2 mg/dL (ref 8.4–10.5)
Chloride: 106 mEq/L (ref 96–112)
Creat: 0.98 mg/dL (ref 0.50–1.10)
GFR, Est African American: 81 mL/min
GFR, Est Non African American: 70 mL/min
Glucose, Bld: 84 mg/dL (ref 70–99)
Potassium: 4 mEq/L (ref 3.5–5.3)
SODIUM: 141 meq/L (ref 135–145)
TOTAL PROTEIN: 7.5 g/dL (ref 6.0–8.3)
Total Bilirubin: 0.4 mg/dL (ref 0.2–1.2)

## 2014-01-17 NOTE — Patient Instructions (Addendum)
Keeping You Healthy  Get These Tests 1. Blood Pressure- Have your blood pressure checked once a year by your health care provider.  Normal blood pressure is 120/80. 2. Weight- Have your body mass index (BMI) calculated to screen for obesity.  BMI is measure of body fat based on height and weight.  You can also calculate your own BMI at GravelBags.it. 3. Cholesterol- Have your cholesterol checked every 5 years starting at age 44 then yearly starting at age 77. 21. Chlamydia, HIV, and other sexually transmitted diseases- Get screened every year until age 35, then within three months of each new sexual provider. 5. Pap Smear- Every 1-3 years; discuss with your health care provider. 6. Mammogram- Every year starting at age 57. There is a fund to assist women who have no insurance; you can contact the [place] or [place] to ask about this financial assistance.  Take these medicines  Calcium with Vitamin D-Your body needs 1200 mg of Calcium each day and [phone] IU of Vitamin D daily.  Your body can only absorb 500 mg of Calcium at a time so Calcium must be taken in 2 or 3 divided doses throughout the day.  Multivitamin with folic acid- Once daily if it is possible for you to become pregnant.  Get these Immunizations  Menactra-Single dose; prevents meningitis.  Tetanus shot- Every 10 years. Tdap given in August 2013; next Tetanus due in 2023.  Flu shot-Every year.  Take these steps 1. Do not smoke-Your healthcare provider can help you quit.  For tips on how to quit go to www.smokefree.gov or call 1-800 QUITNOW. 2. Be physically active- Exercise 5 days a week for at least 30 minutes.  If you are not already physically active, start slow and gradually work up to 30 minutes of moderate physical activity.  Examples of moderate activity include walking briskly, dancing, swimming, bicycling, etc. 3. Breast Cancer- A self breast exam every month is important for early detection of  breast cancer.  For more information and instruction on self breast exams, ask your healthcare provider or https://www.patel.info/. 4. Eat a healthy diet- Eat a variety of healthy foods such as fruits, vegetables, whole grains, low fat milk, low fat cheeses, yogurt, lean meats, poultry and fish, beans, nuts, tofu, etc.  For more information go to www. Thenutritionsource.org 5. Drink alcohol in moderation- Limit alcohol intake to one drink or less per day. Never drink and drive. 6. Depression- Your emotional health is as important as your physical health.  If you're feeling down or losing interest in things you normally enjoy please talk to your healthcare provider about being screened for depression. 7. Dental visit- Brush and floss your teeth twice daily; visit your dentist twice a year. 8. Eye doctor- Get an eye exam at least every 2 years. 9. Helmet use- Always wear a helmet when riding a bicycle, motorcycle, rollerblading or skateboarding. 22. Safe sex- If you may be exposed to sexually transmitted infections, use a condom. 11. Seat belts- Seat belts can save your live; always wear one. 12. Smoke/Carbon Monoxide detectors- These detectors need to be installed on the appropriate level of your home. Replace batteries at least once a year. 13. Skin cancer- When out in the sun please cover up and use sunscreen 15 SPF or higher. 14. Violence- If anyone is threatening or hurting you, please tell your healthcare provider.   Constipation, Adult Constipation is when a person has fewer than 3 bowel movements a week; has difficulty having  a bowel movement; or has stools that are dry, hard, or larger than normal. As people grow older, constipation is more common. If you try to fix constipation with medicines that make you have a bowel movement (laxatives), the problem may get worse. Long-term laxative use may cause the muscles of the colon to become weak. A low-fiber diet, not taking in  enough fluids, and taking certain medicines may make constipation worse. CAUSES   Certain medicines, such as antidepressants, pain medicine, iron supplements, antacids, and water pills.   Certain diseases, such as diabetes, irritable bowel syndrome (IBS), thyroid disease, or depression.   Not drinking enough water.   Not eating enough fiber-rich foods.   Stress or travel.  Lack of physical activity or exercise.  Not going to the restroom when there is the urge to have a bowel movement.  Ignoring the urge to have a bowel movement.  Using laxatives too much. SYMPTOMS   Having fewer than 3 bowel movements a week.   Straining to have a bowel movement.   Having hard, dry, or larger than normal stools.   Feeling full or bloated.   Pain in the lower abdomen.  Not feeling relief after having a bowel movement. DIAGNOSIS  Your caregiver will take a medical history and perform a physical exam. Further testing may be done for severe constipation. Some tests may include:   A barium enema X-ray to examine your rectum, colon, and sometimes, your small intestine.  A sigmoidoscopy to examine your lower colon.  A colonoscopy to examine your entire colon. TREATMENT  Treatment will depend on the severity of your constipation and what is causing it. Some dietary treatments include drinking more fluids and eating more fiber-rich foods. Lifestyle treatments may include regular exercise. If these diet and lifestyle recommendations do not help, your caregiver may recommend taking over-the-counter laxative medicines to help you have bowel movements. Prescription medicines may be prescribed if over-the-counter medicines do not work.  HOME CARE INSTRUCTIONS   Increase dietary fiber in your diet, such as fruits, vegetables, whole grains, and beans. Limit high-fat and processed sugars in your diet, such as [place] fries, hamburgers, cookies, candies, and soda.   A fiber supplement may be  added to your diet if you cannot get enough fiber from foods.   Drink enough fluids to keep your urine clear or pale yellow.   Exercise regularly or as directed by your caregiver.   Go to the restroom when you have the urge to go. Do not hold it.  Only take medicines as directed by your caregiver. Do not take other medicines for constipation without talking to your caregiver first. [place] IF:   You have bright red blood in your stool.   Your constipation lasts for more than 4 days or gets worse.   You have abdominal or rectal pain.   You have thin, pencil-like stools.  You have unexplained weight loss. MAKE SURE YOU:   Understand these instructions.  Will watch your condition.  Will get help right away if you are not doing well or get worse. Document Released: 07/16/2004 Document Revised: 01/10/2012 Document Reviewed: 07/30/2013 [facility] Patient Information 2014 [place], [place].

## 2014-01-17 NOTE — Progress Notes (Signed)
Subjective:    Patient ID: Carrie Romero, female    DOB: 11-12-1970, 43 y.o.   MRN: [id number]  HPI  This 43 y.o. AA female is here for CPE w/o PAP (s/p Lap TAH in 2012).   HCM:  IMM- Tdap current; pt declines Flu vaccine.            MMG- To be scheduled; pt cannot afford (last 2014- negative).  Patient Active Problem List   Diagnosis Date Noted  . 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 Depression with anxiety 02/14/2013  . Migraines 02/02/2012  . Sickle cell trait 02/02/2012   Prior to Admission medications   Medication Sig Start Date End Date Taking? Authorizing Provider  ALPRAZolam Duanne Moron) 0.5 MG tablet Take 1 tablet (0.5 mg total) by mouth 2 (two) times daily as needed for anxiety. 12/04/13  Yes Barton Fanny, MD  amitriptyline (ELAVIL) 100 MG tablet Take 1 tablet (100 mg total) by mouth at bedtime. 09/18/13  Yes Barton Fanny, MD  CALCIUM PO Take by mouth. 1000MG    Yes Historical Provider, MD  cetirizine (ZYRTEC) 10 MG tablet Take 10 mg by mouth 2 (two) times daily.   Yes Historical Provider, MD  Cholecalciferol (VITAMIN D PO) Take 1,000 Units by mouth.   Yes Historical Provider, MD  MAGNESIUM PO Take by mouth.   Yes Historical Provider, MD  valACYclovir (VALTREX) 1000 MG tablet Take 1 tablet daily. 09/18/13  Yes Barton Fanny, MD  vitamin E 1000 UNIT capsule Take 1,000 Units by mouth daily.     Yes Historical Provider, MD   PMHx, Surg Hx, Soc and Fam Hx reviewed.   Review of Systems  Gastrointestinal: Positive for constipation.  Allergic/Immunologic: Positive for environmental allergies and food allergies.       Cantaloupe allergy   Neurological: Positive for headaches.  Hematological: Negative.        Patient requests TB skin test, CBC, CMET, and HIV screening  All other systems reviewed and are negative.      Objective:   Physical Exam  Nursing note and vitals reviewed. Constitutional: She is oriented to person, place, and time. She appears  well-developed and well-nourished. No distress.  HENT:  Head: Normocephalic and atraumatic.  Right Ear: Hearing, tympanic membrane, external ear and ear canal normal.  Left Ear: Hearing, tympanic membrane, external ear and ear canal normal.  Nose: Nose normal. No nasal deformity or septal deviation.  Mouth/Throat: Uvula is midline, oropharynx is clear and moist and mucous membranes are normal. No oral lesions. Normal dentition. No dental caries.  Eyes: Conjunctivae, EOM and lids are normal. Pupils are equal, round, and reactive to light. No scleral icterus.  Wears corrective lenses- exam current.  Neck: Trachea normal, normal range of motion and full passive range of motion without pain. Neck supple. No spinous process tenderness and no muscular tenderness present. No mass and no thyromegaly present.  Cardiovascular: Normal rate, regular rhythm, S1 normal, S2 normal, normal heart sounds and normal pulses.   No extrasystoles are present. PMI is not displaced.  Exam reveals no gallop and no friction rub.   No murmur heard. Pulmonary/Chest: Effort normal and breath sounds normal. No respiratory distress. Right breast exhibits no inverted nipple, no mass, no nipple discharge, no skin change and no tenderness. Left breast exhibits no inverted nipple, no mass, no nipple discharge, no skin change and no tenderness. Breasts are symmetrical.  Abdominal: Soft. Normal appearance and bowel sounds are normal. She exhibits no distension and  no mass. There is no hepatosplenomegaly. There is no tenderness. There is no guarding and no CVA tenderness.  Genitourinary:  Deferred.  Musculoskeletal: Normal range of motion. She exhibits no edema and no tenderness.  Lymphadenopathy:       Head (right side): No submental, no submandibular, no tonsillar, no preauricular, no posterior auricular and no occipital adenopathy present.       Head (left side): No submental, no submandibular, no tonsillar, no preauricular and no  posterior auricular adenopathy present.    She has no cervical adenopathy.    She has no axillary adenopathy.       Right: No inguinal and no supraclavicular adenopathy present.       Left: No inguinal and no supraclavicular adenopathy present.  Neurological: She is alert and oriented to person, place, and time. She has normal strength and normal reflexes. She displays no atrophy. No cranial nerve deficit or sensory deficit. She exhibits normal muscle tone. She displays a negative Romberg sign. Coordination and gait normal.  Skin: Skin is warm, dry and intact. No bruising, no lesion and no rash noted. She is not diaphoretic. No cyanosis or erythema. Nails show no clubbing.  Psychiatric: Her speech is normal and behavior is normal. Judgment and thought content normal. Cognition and memory are normal.      Assessment & Plan:  Routine general medical examination at a health care facility - Plan: COMPLETE METABOLIC PANEL WITH GFR  Pure hypercholesterolemia - Plan: Lipid panel  Unspecified constipation- Increase fiber, improve nutrition and stay active.  Screening examination for venereal disease - Plan: RPR, HIV antibody

## 2014-01-18 LAB — HIV ANTIBODY (ROUTINE TESTING W REFLEX): HIV: NONREACTIVE

## 2014-01-18 LAB — RPR

## 2014-01-19 ENCOUNTER — Encounter: Payer: Self-pay | Admitting: Family Medicine

## 2014-02-14 ENCOUNTER — Other Ambulatory Visit: Payer: Self-pay | Admitting: Family Medicine

## 2014-02-14 NOTE — Telephone Encounter (Signed)
Alprazolam refill authorized- phoned to pharmacy.

## 2014-02-18 ENCOUNTER — Other Ambulatory Visit: Payer: Self-pay | Admitting: Family Medicine

## 2014-04-25 ENCOUNTER — Other Ambulatory Visit: Payer: Self-pay | Admitting: Family Medicine

## 2014-06-21 ENCOUNTER — Telehealth: Payer: Self-pay | Admitting: Family Medicine

## 2014-06-21 NOTE — Telephone Encounter (Signed)
Pt wanted to let you know that she stopped the amitriptyline because she was worried about long term SE.

## 2014-06-21 NOTE — Telephone Encounter (Signed)
Patient wants to discuss medications with Dr. Leward Quan.  [phone]

## 2014-08-16 ENCOUNTER — Other Ambulatory Visit: Payer: Self-pay

## 2014-09-02 ENCOUNTER — Encounter: Payer: Self-pay | Admitting: Family Medicine

## 2014-09-25 ENCOUNTER — Other Ambulatory Visit: Payer: Self-pay | Admitting: Radiology

## 2014-09-25 MED ORDER — ALPRAZOLAM 0.5 MG PO TABS: 0.5000 mg | ORAL_TABLET | Freq: Two times a day (BID) | ORAL | Status: DC | PRN

## 2014-09-25 NOTE — Telephone Encounter (Signed)
Alprazolam refill phoned to pt's pharmacy. 

## 2014-09-25 NOTE — Telephone Encounter (Signed)
Patient has called would like refill on Alprazolam/ pended. She is going out of town at noon, states she has contacted pharmacy, but I do not see anything from the pharmacy

## 2014-11-26 ENCOUNTER — Encounter (HOSPITAL_COMMUNITY): Payer: Self-pay | Admitting: Emergency Medicine

## 2014-11-26 ENCOUNTER — Emergency Department (HOSPITAL_COMMUNITY)
Admission: EM | Admit: 2014-11-26 | Discharge: 2014-11-26 | Disposition: A | Discharge status: Home / Self Care | Payer: No Typology Code available for payment source | Attending: Emergency Medicine | Admitting: Emergency Medicine

## 2014-11-26 DIAGNOSIS — S161XXA Strain of muscle, fascia and tendon at neck level, initial encounter: Secondary | ICD-10-CM

## 2014-11-26 DIAGNOSIS — Y9389 Activity, other specified: Secondary | ICD-10-CM | POA: Diagnosis not present

## 2014-11-26 DIAGNOSIS — Y92410 Unspecified street and highway as the place of occurrence of the external cause: Secondary | ICD-10-CM | POA: Insufficient documentation

## 2014-11-26 DIAGNOSIS — S3992XA Unspecified injury of lower back, initial encounter: Secondary | ICD-10-CM | POA: Insufficient documentation

## 2014-11-26 DIAGNOSIS — Z79899 Other long term (current) drug therapy: Secondary | ICD-10-CM | POA: Insufficient documentation

## 2014-11-26 DIAGNOSIS — S060X0A Concussion without loss of consciousness, initial encounter: Secondary | ICD-10-CM | POA: Diagnosis not present

## 2014-11-26 DIAGNOSIS — Z862 Personal history of diseases of the blood and blood-forming organs and certain disorders involving the immune mechanism: Secondary | ICD-10-CM | POA: Insufficient documentation

## 2014-11-26 DIAGNOSIS — E785 Hyperlipidemia, unspecified: Secondary | ICD-10-CM | POA: Insufficient documentation

## 2014-11-26 DIAGNOSIS — G43809 Other migraine, not intractable, without status migrainosus: Secondary | ICD-10-CM

## 2014-11-26 DIAGNOSIS — Y998 Other external cause status: Secondary | ICD-10-CM | POA: Insufficient documentation

## 2014-11-26 DIAGNOSIS — K219 Gastro-esophageal reflux disease without esophagitis: Secondary | ICD-10-CM | POA: Diagnosis not present

## 2014-11-26 DIAGNOSIS — Z8742 Personal history of other diseases of the female genital tract: Secondary | ICD-10-CM | POA: Insufficient documentation

## 2014-11-26 DIAGNOSIS — S0990XA Unspecified injury of head, initial encounter: Secondary | ICD-10-CM | POA: Diagnosis present

## 2014-11-26 DIAGNOSIS — M62830 Muscle spasm of back: Secondary | ICD-10-CM

## 2014-11-26 DIAGNOSIS — R112 Nausea with vomiting, unspecified: Secondary | ICD-10-CM

## 2014-11-26 DIAGNOSIS — F329 Major depressive disorder, single episode, unspecified: Secondary | ICD-10-CM | POA: Diagnosis not present

## 2014-11-26 MED ORDER — NAPROXEN 500 MG PO TABS: 500.0000 mg | ORAL_TABLET | Freq: Two times a day (BID) | ORAL | Status: DC | PRN

## 2014-11-26 MED ORDER — KETOROLAC TROMETHAMINE 30 MG/ML IJ SOLN
30.0000 mg | Freq: Once | INTRAMUSCULAR | Status: AC
  Administered 2014-11-26: 30 mg via INTRAVENOUS
  Filled 2014-11-26: qty 1

## 2014-11-26 MED ORDER — METOCLOPRAMIDE HCL 10 MG PO TABS: 10.0000 mg | ORAL_TABLET | Freq: Four times a day (QID) | ORAL | Status: DC | PRN

## 2014-11-26 MED ORDER — HYDROCODONE-ACETAMINOPHEN 5-325 MG PO TABS: 1.0000 | ORAL_TABLET | Freq: Four times a day (QID) | ORAL | Status: DC | PRN

## 2014-11-26 MED ORDER — PROCHLORPERAZINE EDISYLATE 5 MG/ML IJ SOLN
10.0000 mg | Freq: Once | INTRAMUSCULAR | Status: AC
  Administered 2014-11-26: 10 mg via INTRAVENOUS
  Filled 2014-11-26: qty 2

## 2014-11-26 MED ORDER — DIPHENHYDRAMINE HCL 25 MG PO CAPS
25.0000 mg | ORAL_CAPSULE | Freq: Once | ORAL | Status: DC
  Filled 2014-11-26: qty 1

## 2014-11-26 MED ORDER — SODIUM CHLORIDE 0.9 % IV BOLUS (SEPSIS)
1000.0000 mL | Freq: Once | INTRAVENOUS | Status: AC
  Administered 2014-11-26: 1000 mL via INTRAVENOUS

## 2014-11-26 MED ORDER — PROCHLORPERAZINE EDISYLATE 5 MG/ML IJ SOLN: 10.0000 mg | Freq: Four times a day (QID) | INTRAMUSCULAR | Status: DC | PRN

## 2014-11-26 MED ORDER — PROCHLORPERAZINE MALEATE 10 MG PO TABS
10.0000 mg | ORAL_TABLET | Freq: Once | ORAL | Status: DC
  Filled 2014-11-26: qty 1

## 2014-11-26 MED ORDER — KETOROLAC TROMETHAMINE 60 MG/2ML IM SOLN
60.0000 mg | Freq: Once | INTRAMUSCULAR | Status: DC
  Filled 2014-11-26: qty 2

## 2014-11-26 MED ORDER — DIPHENHYDRAMINE HCL 50 MG/ML IJ SOLN
25.0000 mg | Freq: Once | INTRAMUSCULAR | Status: AC
  Administered 2014-11-26: 25 mg via INTRAVENOUS
  Filled 2014-11-26: qty 1

## 2014-11-26 MED ORDER — CYCLOBENZAPRINE HCL 10 MG PO TABS: 10.0000 mg | ORAL_TABLET | Freq: Three times a day (TID) | ORAL | Status: DC | PRN
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
## 2014-12-12 ENCOUNTER — Encounter (HOSPITAL_COMMUNITY): Payer: Self-pay | Admitting: Emergency Medicine

## 2014-12-12 ENCOUNTER — Emergency Department (HOSPITAL_COMMUNITY)
Admission: EM | Admit: 2014-12-12 | Discharge: 2014-12-12 | Disposition: A | Discharge status: Home / Self Care | Payer: No Typology Code available for payment source | Attending: Emergency Medicine | Admitting: Emergency Medicine

## 2014-12-12 DIAGNOSIS — F329 Major depressive disorder, single episode, unspecified: Secondary | ICD-10-CM | POA: Insufficient documentation

## 2014-12-12 DIAGNOSIS — M549 Dorsalgia, unspecified: Secondary | ICD-10-CM | POA: Insufficient documentation

## 2014-12-12 DIAGNOSIS — K219 Gastro-esophageal reflux disease without esophagitis: Secondary | ICD-10-CM | POA: Diagnosis not present

## 2014-12-12 DIAGNOSIS — Z862 Personal history of diseases of the blood and blood-forming organs and certain disorders involving the immune mechanism: Secondary | ICD-10-CM | POA: Diagnosis not present

## 2014-12-12 DIAGNOSIS — Z791 Long term (current) use of non-steroidal anti-inflammatories (NSAID): Secondary | ICD-10-CM | POA: Diagnosis not present

## 2014-12-12 DIAGNOSIS — Z8739 Personal history of other diseases of the musculoskeletal system and connective tissue: Secondary | ICD-10-CM | POA: Insufficient documentation

## 2014-12-12 DIAGNOSIS — Z8719 Personal history of other diseases of the digestive system: Secondary | ICD-10-CM | POA: Insufficient documentation

## 2014-12-12 MED ORDER — KETOROLAC TROMETHAMINE 60 MG/2ML IM SOLN
30.0000 mg | Freq: Once | INTRAMUSCULAR | Status: AC
  Administered 2014-12-12: 30 mg via INTRAMUSCULAR
  Filled 2014-12-12: qty 2

## 2014-12-12 MED ORDER — METHOCARBAMOL 500 MG PO TABS: 1000.0000 mg | ORAL_TABLET | Freq: Four times a day (QID) | ORAL | Status: DC | PRN

## 2014-12-12 NOTE — ED Notes (Signed)
Per pt, states she was in [facility] on the 26th of Jan-still having lower back pain
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
## 2014-12-12 NOTE — Discharge Instructions (Signed)
You have had your Toradol shot today, Starting tomorrow: take up to 800mg  of Motrin (also known as ibuprofen). That is usually 4 over the counter pills,  3 times a day. Take with food to minimize stomach irritation   For breakthrough pain you may take Robaxin. Do not drink alcohol, drive or operate heavy machinery when taking Robaxin. Please follow with your primary care doctor in the next 2 days for a check-up. They must obtain records for further management.   Do not hesitate to return to the Emergency Department for any new, worsening or concerning symptoms.    Back Exercises Back exercises help treat and prevent back injuries. The goal of back exercises is to increase the strength of your abdominal and back muscles and the flexibility of your back. These exercises should be started when you no longer have back pain. Back exercises include:  Pelvic Tilt. Lie on your back with your knees bent. Tilt your pelvis until the lower part of your back is against the floor. Hold this position 5 to 10 sec and repeat 5 to 10 times.  Knee to Chest. Pull first 1 knee up against your chest and hold for 20 to 30 seconds, repeat this with the other knee, and then both knees. This may be done with the other leg straight or bent, whichever feels better.  Sit-Ups or Curl-Ups. Bend your knees 90 degrees. Start with tilting your pelvis, and do a partial, slow sit-up, lifting your trunk only 30 to 45 degrees off the floor. Take at least 2 to 3 seconds for each sit-up. Do not do sit-ups with your knees out straight. If partial sit-ups are difficult, simply do the above but with only tightening your abdominal muscles and holding it as directed.  Hip-Lift. Lie on your back with your knees flexed 90 degrees. Push down with your feet and shoulders as you raise your hips a couple inches off the floor; hold for 10 seconds, repeat 5 to 10 times.  Back arches. Lie on your stomach, propping yourself up on bent elbows. Slowly  press on your hands, causing an arch in your low back. Repeat 3 to 5 times. Any initial stiffness and discomfort should lessen with repetition over time.  Shoulder-Lifts. Lie face down with arms beside your body. Keep hips and torso pressed to floor as you slowly lift your head and shoulders off the floor. Do not overdo your exercises, especially in the beginning. Exercises may cause you some mild back discomfort which lasts for a few minutes; however, if the pain is more severe, or lasts for more than 15 minutes, do not continue exercises until you see your caregiver. Improvement with exercise therapy for back problems is slow.  See your caregivers for assistance with developing a proper back exercise program. Document Released: 11/25/2004 Document Revised: 01/10/2012 Document Reviewed: 08/19/2011 [facility] Patient Information 2015 [place], [place]. This information is not intended to replace advice given to you by your health care provider. Make sure you discuss any questions you have with your health care provider.

## 2015-03-11 ENCOUNTER — Telehealth: Payer: Self-pay | Admitting: *Deleted

## 2015-03-11 DIAGNOSIS — Z01419 Encounter for gynecological examination (general) (routine) without abnormal findings: Secondary | ICD-10-CM

## 2015-03-11 NOTE — Telephone Encounter (Signed)
Pt has annual scheduled on 04/25/15 requesting labs prior to annual. Please advise

## 2015-03-11 NOTE — Telephone Encounter (Signed)
Pt aware will be in on 04/21/15

## 2015-03-11 NOTE — Telephone Encounter (Signed)
CBC, comprehensive metabolic panel, lipid profile, urinalysis, vitamin D

## 2015-04-21 ENCOUNTER — Other Ambulatory Visit: Payer: PRIVATE HEALTH INSURANCE

## 2015-04-21 DIAGNOSIS — Z01419 Encounter for gynecological examination (general) (routine) without abnormal findings: Secondary | ICD-10-CM

## 2015-04-21 LAB — COMPLETE METABOLIC PANEL WITH GFR
ALT: 13 U/L (ref 0–35)
AST: 15 U/L (ref 0–37)
Albumin: 4.1 g/dL (ref 3.5–5.2)
Alkaline Phosphatase: 63 U/L (ref 39–117)
BILIRUBIN TOTAL: 0.4 mg/dL (ref 0.2–1.2)
BUN: 11 mg/dL (ref 6–23)
CO2: 24 meq/L (ref 19–32)
Calcium: 9 mg/dL (ref 8.4–10.5)
Chloride: 107 mEq/L (ref 96–112)
Creat: 0.88 mg/dL (ref 0.50–1.10)
GFR, Est Non African American: 80 mL/min
Glucose, Bld: 99 mg/dL (ref 70–99)
POTASSIUM: 4 meq/L (ref 3.5–5.3)
Sodium: 140 mEq/L (ref 135–145)
Total Protein: 7.3 g/dL (ref 6.0–8.3)

## 2015-04-21 LAB — CBC WITH DIFFERENTIAL/PLATELET
BASOS ABS: 0 10*3/uL (ref 0.0–0.1)
BASOS PCT: 1 % (ref 0–1)
Eosinophils Absolute: 0.1 10*3/uL (ref 0.0–0.7)
Eosinophils Relative: 2 % (ref 0–5)
HEMATOCRIT: 36.8 % (ref 36.0–46.0)
HEMOGLOBIN: 12.3 g/dL (ref 12.0–15.0)
LYMPHS ABS: 1.6 10*3/uL (ref 0.7–4.0)
LYMPHS PCT: 38 % (ref 12–46)
MCH: 28.8 pg (ref 26.0–34.0)
MCHC: 33.4 g/dL (ref 30.0–36.0)
MCV: 86.2 fL (ref 78.0–100.0)
MONO ABS: 0.3 10*3/uL (ref 0.1–1.0)
MPV: 9.6 fL (ref 8.6–12.4)
Monocytes Relative: 7 % (ref 3–12)
Neutro Abs: 2.1 10*3/uL (ref 1.7–7.7)
Neutrophils Relative %: 52 % (ref 43–77)
Platelets: 336 10*3/uL (ref 150–400)
RBC: 4.27 MIL/uL (ref 3.87–5.11)
RDW: 13.7 % (ref 11.5–15.5)
WBC: 4.1 10*3/uL (ref 4.0–10.5)

## 2015-04-21 LAB — LIPID PANEL
Cholesterol: 224 mg/dL — ABNORMAL HIGH (ref 0–200)
HDL: 59 mg/dL (ref >=46)
LDL Cholesterol: 154 mg/dL — ABNORMAL HIGH (ref 0–99)
TRIGLYCERIDES: 54 mg/dL (ref <150)
Total CHOL/HDL Ratio: 3.8 Ratio
VLDL: 11 mg/dL (ref 0–40)

## 2015-04-22 LAB — URINALYSIS W MICROSCOPIC + REFLEX CULTURE
Bacteria, UA: NONE SEEN
Bilirubin Urine: NEGATIVE
Casts: NONE SEEN
Crystals: NONE SEEN
Glucose, UA: NEGATIVE mg/dL
Hgb urine dipstick: NEGATIVE
KETONES UR: NEGATIVE mg/dL
LEUKOCYTES UA: NEGATIVE
Nitrite: NEGATIVE
PH: 6 (ref 5.0–8.0)
Protein, ur: NEGATIVE mg/dL
SPECIFIC GRAVITY, URINE: 1.012 (ref 1.005–1.030)
Squamous Epithelial / LPF: NONE SEEN
UROBILINOGEN UA: 0.2 mg/dL (ref 0.0–1.0)

## 2015-04-22 LAB — VITAMIN D 25 HYDROXY (VIT D DEFICIENCY, FRACTURES): Vit D, 25-Hydroxy: 26 ng/mL — ABNORMAL LOW (ref 30–100)

## 2015-04-23 ENCOUNTER — Other Ambulatory Visit: Payer: Self-pay | Admitting: Gynecology

## 2015-04-23 DIAGNOSIS — E559 Vitamin D deficiency, unspecified: Secondary | ICD-10-CM

## 2015-04-25 ENCOUNTER — Encounter: Payer: Self-pay | Admitting: Gynecology

## 2015-04-25 ENCOUNTER — Ambulatory Visit (INDEPENDENT_AMBULATORY_CARE_PROVIDER_SITE_OTHER): Payer: PRIVATE HEALTH INSURANCE | Admitting: Gynecology

## 2015-04-25 ENCOUNTER — Other Ambulatory Visit (HOSPITAL_COMMUNITY)
Admission: RE | Admit: 2015-04-25 | Discharge: 2015-04-25 | Disposition: A | Discharge status: Home / Self Care | Payer: PRIVATE HEALTH INSURANCE | Source: Ambulatory Visit | Attending: Gynecology | Admitting: Gynecology

## 2015-04-25 VITALS — BP 120/70 | Ht 62.0 in | Wt 158.0 lb

## 2015-04-25 DIAGNOSIS — Z01419 Encounter for gynecological examination (general) (routine) without abnormal findings: Secondary | ICD-10-CM

## 2015-04-25 MED ORDER — ALPRAZOLAM 0.5 MG PO TABS: 0.5000 mg | ORAL_TABLET | Freq: Two times a day (BID) | ORAL | Status: DC | PRN

## 2015-04-25 MED ORDER — VALACYCLOVIR HCL 500 MG PO TABS: 500.0000 mg | ORAL_TABLET | Freq: Two times a day (BID) | ORAL | Status: DC

## 2015-04-25 NOTE — Progress Notes (Signed)
Carrie Romero Jun 15, 1971 [id number]        44 y.o.  [id number] for annual exam.  Several issues noted below.  Past medical history,surgical history, problem list, medications, allergies, family history and social history were all reviewed and documented as reviewed in the EPIC chart.  ROS:  Performed with pertinent positives and negatives included in the history, assessment and plan.   Additional significant findings :  none   Exam: [name] Counsellor Vitals:   04/25/15 1143  BP: 120/70  Height: 5\' 2"  (1.575 m)  Weight: 158 lb (71.668 kg)   General appearance:  Normal affect, orientation and appearance. Skin: Grossly normal HEENT: Without gross lesions.  No cervical or supraclavicular adenopathy. Thyroid normal.  Lungs:  Clear without wheezing, rales or rhonchi Cardiac: RR, without RMG Abdominal:  Soft, nontender, without masses, guarding, rebound, organomegaly or hernia Breasts:  Examined lying and sitting without masses, retractions, discharge or axillary adenopathy. Pelvic:  Ext/BUS/vagina normal. Pap smear of cuff done  Adnexa  Without masses or tenderness    Anus and perineum  Normal   Rectovaginal  Normal sphincter tone without palpated masses or tenderness.    Assessment/Plan:  44 y.o. [id number] female for annual exam. Status post [place] 2012 for menorrhagia.  1. Pap smear 2013.  Pap smear of vaginal cuff today. History of LGSIL 2011.  Cervical pathology from her hysterectomy was benign with no residual dysplasia. 2. Mammography 2014. Recommended repeat mammogram now and patient agrees to schedule. SBE monthly reviewed. 3. Depression/anxiety. Patient had been on Xanax 0.5 mg that she used intermittently. Was prescribed by her primary physician who longer is practicing per her history. It sounds on questioning that she actually has more anxiety that triggers some resulting depression that the Xanax helps with. Options to switch to a daily anxiolytic/antidepressant such as  fluoxetine discussed. Patient is not interested in taking something every day but really feels that the intermittent Xanax helps. Xanax 0.5 mg #60 with 1 refill provided. 4. HSV. Patient has occasional genital HSV outbreaks. Uses Valtrex intermittently for this. Valtrex 500 mg #30 one by mouth twice a day 5 days with 3 refills provided. 5. Health maintenance. Patient had lab work drawn before she saw me. Vitamin D 26 and she is going to repeat her vitamin D.  Cholesterol 224 LDL 154. Patient's going to repeat a fasting lipid profile in 6 months with her vitamin D and increase exercise decrease fat in her diet. Remainder of her lab work is normal. Follow up in one year, sooner as needed.   Anastasio Auerbach MD, 12:13 PM 04/25/2015

## 2015-04-25 NOTE — Addendum Note (Signed)
Addended by: Nelva Nay on: 04/25/2015 12:25 PM   Modules accepted: Orders

## 2015-04-25 NOTE — Patient Instructions (Signed)
Call to Schedule your mammogram  Facilities in [place]: 1)  [facility], [address] Phone: [phone] 2)  [facility], [address] Phone: [phone] 3)  Dr. Bertrand at [facility]  [address] Phone: [phone]     Mammogram A mammogram is an X-ray test to find changes in a woman's breast. You should get a mammogram if:  You are 40 years of age or older  You have risk factors.   Your doctor recommends that you have one.  BEFORE THE TEST  Do not schedule the test the week before your period, especially if your breasts are sore during this time.  On the day of your mammogram:  Wash your breasts and armpits well. After washing, do not put on any deodorant or talcum powder on until after your test.   Eat and drink as you usually do.   Take your medicines as usual.   If you are diabetic and take insulin, make sure you:   Eat before coming for your test.   Take your insulin as usual.   If you cannot keep your appointment, call before the appointment to cancel. Schedule another appointment.  TEST  You will need to undress from the waist up. You will put on a hospital gown.   Your breast will be put on the mammogram machine, and it will press firmly on your breast with a piece of plastic called a compression paddle. This will make your breast flatter so that the machine can X-ray all parts of your breast.   Both breasts will be X-rayed. Each breast will be X-rayed from above and from the side. An X-ray might need to be taken again if the picture is not good enough.   The mammogram will last about 15 to 30 minutes.  AFTER THE TEST Finding out the results of your test Ask when your test results will be ready. Make sure you get your test results.  Document Released: 01/14/2009 Document Revised: 10/07/2011 Document Reviewed: 01/14/2009 [employer] Patient  Information 2012 [employer]   

## 2015-04-30 LAB — CYTOLOGY - PAP

## 2015-10-03 ENCOUNTER — Other Ambulatory Visit: Payer: Self-pay

## 2015-10-06 MED ORDER — ALPRAZOLAM 0.5 MG PO TABS: 0.5000 mg | ORAL_TABLET | Freq: Two times a day (BID) | ORAL | Status: DC | PRN

## 2015-10-06 NOTE — Telephone Encounter (Signed)
rx sent

## 2015-11-26 ENCOUNTER — Telehealth: Payer: Self-pay | Admitting: *Deleted

## 2015-11-26 MED ORDER — FLUCONAZOLE 150 MG PO TABS: 150.0000 mg | ORAL_TABLET | Freq: Once | ORAL | Status: DC

## 2015-11-26 NOTE — Telephone Encounter (Signed)
Pt aware Rx sent.  

## 2015-11-26 NOTE — Telephone Encounter (Signed)
Diflucan 150 mg 1 dose, office visit if symptoms persist

## 2015-11-26 NOTE — Telephone Encounter (Signed)
Pt called c/o vaginal itching only, no odor, took OTC,no relief, pt aware OV best, asked if diflucan tablet could be sent to pharmacy? Please advise

## 2015-11-27 MED FILL — FLUCONAZOLE 150 MG TABLET: 150 | 1 days supply | Qty: 1 | Fill #0

## 2015-12-02 ENCOUNTER — Ambulatory Visit (INDEPENDENT_AMBULATORY_CARE_PROVIDER_SITE_OTHER): Payer: PRIVATE HEALTH INSURANCE | Admitting: Gynecology

## 2015-12-02 ENCOUNTER — Encounter: Payer: Self-pay | Admitting: Gynecology

## 2015-12-02 VITALS — BP 120/70

## 2015-12-02 DIAGNOSIS — R300 Dysuria: Secondary | ICD-10-CM

## 2015-12-02 DIAGNOSIS — N898 Other specified noninflammatory disorders of vagina: Secondary | ICD-10-CM

## 2015-12-02 LAB — URINALYSIS W MICROSCOPIC + REFLEX CULTURE
Bilirubin Urine: NEGATIVE
CASTS: NONE SEEN [LPF]
CRYSTALS: NONE SEEN [HPF]
Glucose, UA: NEGATIVE
HGB URINE DIPSTICK: NEGATIVE
KETONES UR: NEGATIVE
Leukocytes, UA: NEGATIVE
Nitrite: NEGATIVE
PROTEIN: NEGATIVE
RBC / HPF: NONE SEEN RBC/HPF (ref <=2)
Specific Gravity, Urine: 1.01 (ref 1.001–1.035)
Yeast: NONE SEEN [HPF]
pH: 5.5 (ref 5.0–8.0)

## 2015-12-02 LAB — WET PREP FOR TRICH, YEAST, CLUE
CLUE CELLS WET PREP: NONE SEEN
TRICH WET PREP: NONE SEEN

## 2015-12-02 MED ORDER — FLUCONAZOLE 200 MG PO TABS: 200.0000 mg | ORAL_TABLET | Freq: Every day | ORAL | Status: DC

## 2015-12-02 MED ORDER — METRONIDAZOLE 500 MG PO TABS: 500.0000 mg | ORAL_TABLET | Freq: Two times a day (BID) | ORAL | Status: DC

## 2015-12-02 MED FILL — metroNIDAZOLE 500 MG TABS: 500 | 7 days supply | Qty: 14 | Fill #0

## 2015-12-02 MED FILL — FLUCONAZOLE 200 MG TABLET: 200 | 3 days supply | Qty: 3 | Fill #0

## 2015-12-02 NOTE — Addendum Note (Signed)
Addended by: Nelva Nay on: 12/02/2015 03:01 PM   Modules accepted: Orders

## 2015-12-02 NOTE — Patient Instructions (Signed)
Take the Diflucan pill daily for 3 days Take the Flagyl medication twice daily for 7 days. Avoid alcohol while taking. Old if your symptoms persist, worsen or recur.

## 2015-12-02 NOTE — Progress Notes (Signed)
Carrie Romero 11-01-1971 [id number]        45 y.o.  [id number] presents with two-week history of vaginal discharge and burning. No odor or significant itching. Had called last week and was prescribed a Diflucan over the phone. She has persisted with her symptoms.  No urinary symptoms such as frequency dysuria or urgency.  Past medical history,surgical history, problem list, medications, allergies, family history and social history were all reviewed and documented in the EPIC chart.  Directed ROS with pertinent positives and negatives documented in the history of present illness/assessment and plan.  Exam: [name] assistant Filed Vitals:   12/02/15 1411  BP: 120/70   General appearance:  Normal Abdomen soft nontender without masses guarding rebound Pelvic external BUS vagina with frothy yellow discharge. Bimanual without masses or tenderness  Assessment/Plan:  45 y.o. [id number] history as above. The prep does show few yeast. Clinical appearance consistent with bacterial vaginosis. Will treat with Diflucan 200 mg daily 3 days and Flagyl 500 mg twice a day 7 days, alcohol avoidance reviewed. Urinalysis was run and appears contaminated. Will follow with culture and treat accordingly. Follow up if symptoms persist, worsen or recur.    Anastasio Auerbach MD, 2:35 PM 12/02/2015

## 2015-12-03 LAB — URINE CULTURE
Colony Count: NO GROWTH
ORGANISM ID, BACTERIA: NO GROWTH

## 2015-12-23 ENCOUNTER — Encounter: Payer: Self-pay | Admitting: Physician Assistant

## 2015-12-23 ENCOUNTER — Ambulatory Visit (INDEPENDENT_AMBULATORY_CARE_PROVIDER_SITE_OTHER): Payer: PRIVATE HEALTH INSURANCE | Admitting: Physician Assistant

## 2015-12-23 ENCOUNTER — Telehealth: Payer: Self-pay | Admitting: *Deleted

## 2015-12-23 VITALS — BP 124/72 | HR 93 | Temp 98.3°F | Resp 16 | Wt 154.2 lb

## 2015-12-23 DIAGNOSIS — N898 Other specified noninflammatory disorders of vagina: Secondary | ICD-10-CM

## 2015-12-23 DIAGNOSIS — F419 Anxiety disorder, unspecified: Secondary | ICD-10-CM | POA: Diagnosis not present

## 2015-12-23 DIAGNOSIS — B9689 Other specified bacterial agents as the cause of diseases classified elsewhere: Secondary | ICD-10-CM

## 2015-12-23 DIAGNOSIS — N760 Acute vaginitis: Secondary | ICD-10-CM

## 2015-12-23 DIAGNOSIS — A499 Bacterial infection, unspecified: Secondary | ICD-10-CM | POA: Diagnosis not present

## 2015-12-23 LAB — POCT WET + KOH PREP
Trich by wet prep: ABSENT
YEAST BY WET PREP: ABSENT
Yeast by KOH: ABSENT

## 2015-12-23 MED ORDER — ALPRAZOLAM 0.5 MG PO TABS: 0.5000 mg | ORAL_TABLET | Freq: Two times a day (BID) | ORAL | Status: DC | PRN

## 2015-12-23 MED ORDER — ALPRAZOLAM 0.5 MG PO TABS
0.5000 mg | ORAL_TABLET | Freq: Two times a day (BID) | ORAL | Status: DC | PRN
Start: 2015-12-23 — End: 2015-12-23

## 2015-12-23 MED ORDER — FLUCONAZOLE 200 MG PO TABS: 200.0000 mg | ORAL_TABLET | Freq: Every day | ORAL | Status: DC

## 2015-12-23 MED ORDER — METRONIDAZOLE 500 MG PO TABS: 500.0000 mg | ORAL_TABLET | Freq: Two times a day (BID) | ORAL | Status: DC

## 2015-12-23 MED FILL — VALACYCLOVIR HCL 500 MG TAB: 500 | 15 days supply | Qty: 30 | Fill #0

## 2015-12-23 MED FILL — ALPRAZolam 0.5 MG TABS: 0.5 | 30 days supply | Qty: 60 | Fill #1

## 2015-12-23 MED FILL — metroNIDAZOLE 500 MG TABS: 500 | 7 days supply | Qty: 14 | Fill #0

## 2015-12-23 MED FILL — FLUCONAZOLE 200 MG TABLET: 200 | 3 days supply | Qty: 3 | Fill #0

## 2015-12-23 NOTE — Patient Instructions (Signed)
Metronidazole twice a day for 7 days. Fluconazole every 72 hours. Eat yogurt or take probiotic. No alcohol. I have refilled your xanax. CAll me in 3 months and I will refill xanax for you to come pick up. Come back to see me in 6 months

## 2015-12-23 NOTE — Progress Notes (Signed)
[facility] [address], [place] [address] [phone]  Date:  12/23/2015   Name:  Carrie Romero   DOB:  31-Jul-1971   MRN:  [id number]  PCP:  Ellsworth Lennox, MD    Chief Complaint: vaginal discharge and odor and Medication Refill   History of Present Illness:  This is a 45 y.o. female with PMH depression and anxiety, HSV-2, migraines who is presenting needing refill of xanax and complaining of vaginal discharge.  Xanax used to be refilled by Dr. Leward Quan. Looks like lately has been getting refills from GYN, Dr. Phineas Real. She is wanting to get from [place] again. She takes 1-2 times a day. Takes when feeling anxious or angry. She has been on amitriptyline before and came off due to side effects. She has never been on an SSRI. She does not want to be on an antidepressant. States xanax works well for her.  Pt complaining of vaginal odor and discharge x 3 days. She was treated for vaginal yeast inf and BV on 12/02/2015 by GYN. States her symptoms went away completely for 1 week. Started coming back 3 days ago. Having fishy odor. Having vaginal itching and burning. No pain with urination. Is having some urinary frequency. No abdominal pain. No back pain. No fever or chills. Sexually active - condom broke. Prior to that she had not has sex in 1 year. In the past, having sex without a condom has been a trigger for her with BV.  Needing refills of valtrex. Takes prn genital herpes outbreaks.  Review of Systems:  Review of Systems See HPI  Patient Active Problem List   Diagnosis Date Noted  . 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 Depression with anxiety 02/14/2013  . Migraines 02/02/2012  . Sickle cell trait ([place]) 02/02/2012    Prior to Admission medications   Medication Sig Start Date End Date Taking? Authorizing Provider  ALPRAZolam Duanne Moron) 0.5 MG tablet Take 1 tablet (0.5 mg total) by mouth 2 (two) times daily as needed. for anxiety 10/06/15  Yes Anastasio Auerbach,  MD  CALCIUM PO Take 1,000 mg by mouth daily. 1000MG    Yes Historical Provider, MD  cetirizine (ZYRTEC) 10 MG tablet Take 10 mg by mouth 2 (two) times daily.   Yes Historical Provider, MD  Cholecalciferol (VITAMIN D PO) Take 1,000 Units by mouth daily.    Yes Historical Provider, MD  MAGNESIUM PO Take 1 tablet by mouth daily.    Yes Historical Provider, MD  valACYclovir (VALTREX) 500 MG tablet Take 1 tablet (500 mg total) by mouth 2 (two) times daily. For 5 days with outbreak 04/25/15  Yes Anastasio Auerbach, MD    Allergies  Allergen Reactions  . Other Anaphylaxis    cantalope  . Wasp Venom Anaphylaxis  . Hydrocodone Itching    Past Surgical History  Procedure Laterality Date  . Cesarean section  1996  . Cholecystectomy  1993  . Tubal ligation  1996  . Combined hysteroscopy diagnostic / d&c  05/2010  . Total lapr.hysterectomy  04/2011    DR.[place]  . Myomectomy      2011    Social History  Substance Use Topics  . Smoking status: Never Smoker   . Smokeless tobacco: Never Used  . Alcohol Use: 4.2 oz/week    7 Standard drinks or equivalent per week    Family History  Problem Relation Age of Onset  . Diabetes Mother   . Hypertension Mother   .  Arthritis Mother   . Thyroid disease Mother   . Cancer Mother     THYROID  . Diabetes Father     pretty sure  . Hyperlipidemia Father   . Heart disease Father   . Ovarian cancer Sister   . Thyroid cancer Sister   . Cancer Sister     THYROID  . Heart attack Sister   . Breast cancer Maternal Aunt     Age 74's  . Breast cancer Cousin 30    Medication list has been reviewed and updated.  Physical Examination:  Physical Exam  Constitutional: She is oriented to person, place, and time. She appears well-developed and well-nourished. No distress.  HENT:  Head: Normocephalic and atraumatic.  Right Ear: Hearing normal.  Left Ear: Hearing normal.  Nose: Nose normal.  Mouth/Throat: Uvula is midline,  oropharynx is clear and moist and mucous membranes are normal.  Eyes: Conjunctivae and lids are normal. Right eye exhibits no discharge. Left eye exhibits no discharge. No scleral icterus.  Cardiovascular: Normal rate, regular rhythm, normal heart sounds and normal pulses.   No murmur heard. Pulmonary/Chest: Effort normal and breath sounds normal. No respiratory distress. She has no wheezes. She has no rhonchi. She has no rales.  Abdominal: Soft. Normal appearance. There is no tenderness.  Genitourinary: There is no lesion on the right labia. There is no lesion on the left labia. Right adnexum displays no tenderness. Left adnexum displays no tenderness. Vaginal discharge (large amount, white, thick. No odor.) found.  Cervix and uterus absent  Musculoskeletal: Normal range of motion.  Lymphadenopathy:       Head (right side): No submental, no submandibular and no tonsillar adenopathy present.       Head (left side): No submental, no submandibular and no tonsillar adenopathy present.    She has no cervical adenopathy.  Neurological: She is alert and oriented to person, place, and time.  Skin: Skin is warm, dry and intact. No lesion and no rash noted.  Psychiatric: She has a normal mood and affect. Her speech is normal and behavior is normal. Thought content normal.    BP 124/72 mmHg  Pulse 93  Temp(Src) 98.3 F (36.8 C) (Oral)  Resp 16  Wt 154 lb 3.2 oz (69.945 kg)  LMP 10/15/2010  Results for orders placed or performed in visit on 12/23/15  POCT Wet + KOH Prep  Result Value Ref Range   Yeast by KOH Absent Present, Absent   Yeast by wet prep Absent Present, Absent   WBC by wet prep Few None, Few, Too numerous to count   Clue Cells Wet Prep HPF POC Few (A) None, Too numerous to count   Trich by wet prep Absent Present, Absent   Bacteria Wet Prep HPF POC Moderate (A) None, Few, Too numerous to count   Epithelial Cells By [employer] Pref ([facility]) Moderate (A) None, Few, Too numerous to count    RBC,UR,HPF,POC None None RBC/hpf   Assessment and Plan:  1. BV (bacterial vaginosis) 2. Vaginal discharge CLues on wet prep. Will treat with flagyl. Diflucan since flagyl gives her yeast infections. Wear condoms to prevent BV. G/C pending. - metroNIDAZOLE (FLAGYL) 500 MG tablet; Take 1 tablet (500 mg total) by mouth 2 (two) times daily. For 7 days.  Avoid alcohol while taking  Dispense: 14 tablet; Refill: 0 - fluconazole (DIFLUCAN) 200 MG tablet; Take 1 tablet (200 mg total) by mouth daily. For three days  Dispense: 3 tablet; Refill: 0 - GC/Chlamydia Probe  Amp - POCT Wet + KOH Prep  3. Anxiety I encouraged pt to start on an antidepressant. She is not open to that at this time. Xanax refilled. REturn in 6 months for follow up. - ALPRAZolam (XANAX) 0.5 MG tablet; Take 1 tablet (0.5 mg total) by mouth 2 (two) times daily as needed. for anxiety  Dispense: 60 tablet; Refill: 2   Benjaman Pott. Drenda Freeze, MHS [facility]  12/23/2015

## 2015-12-23 NOTE — Telephone Encounter (Signed)
Phoned in valtrex patient informed.

## 2015-12-24 LAB — GC/CHLAMYDIA PROBE AMP
CT Probe RNA: NOT DETECTED
GC Probe RNA: NOT DETECTED

## 2016-01-21 ENCOUNTER — Encounter: Payer: Self-pay | Admitting: Gynecology

## 2016-01-21 ENCOUNTER — Ambulatory Visit (INDEPENDENT_AMBULATORY_CARE_PROVIDER_SITE_OTHER): Payer: PRIVATE HEALTH INSURANCE | Admitting: Gynecology

## 2016-01-21 VITALS — BP 120/74

## 2016-01-21 DIAGNOSIS — N760 Acute vaginitis: Secondary | ICD-10-CM

## 2016-01-21 DIAGNOSIS — Z113 Encounter for screening for infections with a predominantly sexual mode of transmission: Secondary | ICD-10-CM | POA: Diagnosis not present

## 2016-01-21 DIAGNOSIS — B3731 Acute candidiasis of vulva and vagina: Secondary | ICD-10-CM

## 2016-01-21 DIAGNOSIS — B373 Candidiasis of vulva and vagina: Secondary | ICD-10-CM

## 2016-01-21 DIAGNOSIS — A499 Bacterial infection, unspecified: Secondary | ICD-10-CM

## 2016-01-21 DIAGNOSIS — N898 Other specified noninflammatory disorders of vagina: Secondary | ICD-10-CM

## 2016-01-21 DIAGNOSIS — N9489 Other specified conditions associated with female genital organs and menstrual cycle: Secondary | ICD-10-CM

## 2016-01-21 DIAGNOSIS — B9689 Other specified bacterial agents as the cause of diseases classified elsewhere: Secondary | ICD-10-CM

## 2016-01-21 LAB — WET PREP FOR TRICH, YEAST, CLUE
Trich, Wet Prep: NONE SEEN
WBC, Wet Prep HPF POC: NONE SEEN

## 2016-01-21 MED ORDER — FLUCONAZOLE 200 MG PO TABS: 200.0000 mg | ORAL_TABLET | Freq: Once | ORAL | Status: DC

## 2016-01-21 MED ORDER — METRONIDAZOLE 500 MG PO TABS: 500.0000 mg | ORAL_TABLET | Freq: Two times a day (BID) | ORAL | Status: DC

## 2016-01-21 MED FILL — metroNIDAZOLE 500 MG TABS: 500 | 7 days supply | Qty: 14 | Fill #0

## 2016-01-21 MED FILL — FLUCONAZOLE 200 MG TABLET: 200 | 7 days supply | Qty: 3 | Fill #0

## 2016-01-21 NOTE — Patient Instructions (Signed)
Take the Flagyl medication twice daily for 7 days. Avoid alcohol while taking Take the Diflucan pills once at the beginning, middle and end of the Flagyl treatment  If you are interested in blood testing for HIV and other STDs I would recommend returning in 2 months after the exposure.

## 2016-01-21 NOTE — Progress Notes (Signed)
    Carrie Romero [birth year] [id number]        45 y.o.  [id number] presents with several days of vaginal irritation and odor. Had intercourse with new partner several days ago and unclear whether he used condom or not. Also requests STD screening. No urinary symptoms such as frequency dysuria urgency.  Past medical history,surgical history, problem list, medications, allergies, family history and social history were all reviewed and documented in the EPIC chart.  Directed ROS with pertinent positives and negatives documented in the history of present illness/assessment and plan.  Exam: [name] assistant Filed Vitals:   01/21/16 1027  BP: 120/74   General appearance:  Normal Abdomen soft nontender without masses guarding rebound Pelvic external BUS vagina with white thick discharge. Bimanual without masses or tenderness.  Assessment/Plan:  45 y.o. [id number] with history and exam as above. Wet prep is positive for yeast and bacterial vaginosis. Will treat with Flagyl 500 mg twice a day 7 days, alcohol avoidance reviewed. Diflucan 200 mg at beginning, middle and end of the Flagyl dose. This seems to work for her in the past. GC/chlamydia screen done. Reviewed serum screening and this close to exposure the testing would not be positive if she was exposed. It would check her for prior exposures. Patient elects not to do serum screening at this time. I offered for her to return in 2 months for serum screening if she chooses from this exposure. She will decide.  Follow up if the above treatment does not eradicate her symptoms or she has a recurrence.    Anastasio Auerbach MD, 10:46 AM 01/21/2016

## 2016-01-22 LAB — GC/CHLAMYDIA PROBE AMP
CT Probe RNA: NOT DETECTED
GC Probe RNA: NOT DETECTED

## 2016-01-23 ENCOUNTER — Ambulatory Visit: Payer: PRIVATE HEALTH INSURANCE | Admitting: Gynecology

## 2016-03-08 MED FILL — VALACYCLOVIR HCL 500 MG TAB: 500 | 15 days supply | Qty: 30 | Fill #1

## 2016-03-08 MED FILL — ALPRAZolam 0.5 MG TABS: 0.5 | 30 days supply | Qty: 60 | Fill #0

## 2016-05-17 MED FILL — VALACYCLOVIR HCL 500 MG TAB: 500 | 15 days supply | Qty: 30 | Fill #2

## 2016-05-17 MED FILL — ALPRAZolam 0.5 MG TABS: 0.5 | 30 days supply | Qty: 60 | Fill #1

## 2016-05-27 ENCOUNTER — Telehealth: Payer: Self-pay | Admitting: *Deleted

## 2016-05-27 DIAGNOSIS — Z114 Encounter for screening for human immunodeficiency virus [HIV]: Secondary | ICD-10-CM

## 2016-05-27 DIAGNOSIS — Z1321 Encounter for screening for nutritional disorder: Secondary | ICD-10-CM

## 2016-05-27 DIAGNOSIS — Z01419 Encounter for gynecological examination (general) (routine) without abnormal findings: Secondary | ICD-10-CM

## 2016-05-27 DIAGNOSIS — Z13220 Encounter for screening for lipoid disorders: Secondary | ICD-10-CM

## 2016-05-27 NOTE — Telephone Encounter (Signed)
Pt has annual in Oct would like labs done prior to annual. Pt is requesting CBC, Vitamin D,Lipid Profile, CMET , HIV testing. Please advise

## 2016-05-28 NOTE — Telephone Encounter (Signed)
Okay 

## 2016-05-28 NOTE — Telephone Encounter (Signed)
Left on pt voicemail okay for the below, to schedule lab appointment and remember to be fasting after midnight due to lipid profile.

## 2016-06-05 ENCOUNTER — Ambulatory Visit (INDEPENDENT_AMBULATORY_CARE_PROVIDER_SITE_OTHER): Payer: BLUE CROSS/BLUE SHIELD | Admitting: Osteopathic Medicine

## 2016-06-05 VITALS — BP 120/72 | HR 89 | Temp 98.2°F | Ht 62.0 in | Wt 159.0 lb

## 2016-06-05 DIAGNOSIS — A499 Bacterial infection, unspecified: Secondary | ICD-10-CM | POA: Diagnosis not present

## 2016-06-05 DIAGNOSIS — N760 Acute vaginitis: Secondary | ICD-10-CM | POA: Diagnosis not present

## 2016-06-05 DIAGNOSIS — B9689 Other specified bacterial agents as the cause of diseases classified elsewhere: Secondary | ICD-10-CM

## 2016-06-05 DIAGNOSIS — N898 Other specified noninflammatory disorders of vagina: Secondary | ICD-10-CM

## 2016-06-05 LAB — POCT WET PREP (WET MOUNT)
CLUE CELLS WET PREP WHIFF POC: POSITIVE
Trichomonas Wet Prep HPF POC: ABSENT

## 2016-06-05 MED ORDER — METRONIDAZOLE 500 MG PO TABS: 500.0000 mg | ORAL_TABLET | Freq: Two times a day (BID) | ORAL | 0 refills | Status: DC

## 2016-06-05 MED ORDER — FLUCONAZOLE 200 MG PO TABS: 200.0000 mg | ORAL_TABLET | Freq: Once | ORAL | 0 refills | Status: DC

## 2016-06-05 NOTE — Addendum Note (Signed)
Addended by: Carter Kitten on: 06/05/2016 09:14 AM   Modules accepted: Orders

## 2016-06-05 NOTE — Addendum Note (Signed)
Addended by: Kem Boroughs D on: 06/05/2016 09:14 AM   Modules accepted: Orders

## 2016-06-05 NOTE — Progress Notes (Signed)
HPI: Carrie Romero is a 45 y.o. Not Hispanic or Latino female  who presents to [facility] today, 06/05/16,  for chief complaint of:  Chief Complaint  Patient presents with  . Vaginitis    x 1 week,  creamy white discharge  . Urinary Frequency     . Location/Quality: vaginal whitish discharge consistent with previous bacterial vaginosis infections. . Duration: 1 week . Modifying factors: No douche use, no baths, no dysuria/urgency . Assoc signs/symptoms: No fever/chils, no abd pain . Context: Review of previous wet prep results demonstrate previous yeast, clue cells. Patient requests refill of metronidazole and Diflucan. Screening for gonorrhea & chlamydia in March 2017 was negative.  Past medical, surgical, social and family history reviewed: Past Medical History:  Diagnosis Date  . Allergy   . Depression   . GERD (gastroesophageal reflux disease)   . Hyperlipidemia   . 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 Migraine   . Sickle cell anemia (HCC)    Trait only   Past Surgical History:  Procedure Laterality Date  . CESAREAN SECTION  1996  . CHOLECYSTECTOMY  1993  . COMBINED HYSTEROSCOPY DIAGNOSTIC / D&C  05/2010  . MYOMECTOMY     2011  . TOTAL LAPR.HYSTERECTOMY  04/2011   DR.[place]  . TUBAL LIGATION  1996   Social History  Substance Use Topics  . Smoking status: Never Smoker  . Smokeless tobacco: Never Used  . Alcohol use 4.2 oz/week    7 Standard drinks or equivalent per week   Family History  Problem Relation Age of Onset  . Diabetes Mother   . Hypertension Mother   . Arthritis Mother   . Thyroid disease Mother   . Cancer Mother     THYROID  . Diabetes Father     pretty sure  . Hyperlipidemia Father   . Heart disease Father   . Ovarian cancer Sister   . Thyroid cancer Sister   . Cancer Sister     THYROID  . Heart attack Sister   . Breast cancer Maternal Aunt     Age 42's  . Breast  cancer Cousin 30     Current medication list and allergy/intolerance information reviewed:   Current Outpatient Prescriptions  Medication Sig Dispense Refill  . ALPRAZolam (XANAX) 0.5 MG tablet Take 1 tablet (0.5 mg total) by mouth 2 (two) times daily as needed. for anxiety 60 tablet 2  . CALCIUM PO Take 1,000 mg by mouth daily. 1000MG     . cetirizine (ZYRTEC) 10 MG tablet Take 10 mg by mouth 2 (two) times daily.    . fluconazole (DIFLUCAN) 200 MG tablet Take 1 tablet (200 mg total) by mouth once. At the beginning, middle and end of oral antibiotic course 3 tablet 0  . MAGNESIUM PO Take 1 tablet by mouth daily.     . metroNIDAZOLE (FLAGYL) 500 MG tablet Take 1 tablet (500 mg total) by mouth 2 (two) times daily. For 7 days.  Avoid alcohol while taking 14 tablet 0  . valACYclovir (VALTREX) 500 MG tablet Take 1 tablet (500 mg total) by mouth 2 (two) times daily. For 5 days with outbreak 30 tablet 2   No current facility-administered medications for this visit.    Allergies  Allergen Reactions  . Other Anaphylaxis    cantalope  . Wasp Venom Anaphylaxis  . Hydrocodone Itching      Review of Systems:  Constitutional:  No  fever,  no chills, No recent illness,  Cardiac: No  chest pain,   Respiratory:  No  shortness of breath. No  Cough  Gastrointestinal: No  abdominal pain, No  nausea  Musculoskeletal: No new myalgia/arthralgia  Genitourinary: No  incontinence, No  abnormal genital bleeding, (+) abnormal genital discharge, s/p hysterectomy  Skin: No  Rash,    Exam:  BP 120/72 (BP Location: Right Arm, Patient Position: Sitting, Cuff Size: Large)   Pulse 89   Temp 98.2 F (36.8 C) (Oral)   Ht 5\' 2"  (1.575 m)   Wt 159 lb (72.1 kg)   LMP 10/15/2010   SpO2 97%   BMI 29.08 kg/m   Constitutional: VS see above. General Appearance: alert, well-developed, well-nourished, NAD  Respiratory: Normal respiratory effort  Musculoskeletal: Gait normal.  Psychiatric: Normal  judgment/insight. Normal mood and affect. Oriented x3.    Results for orders placed or performed in visit on 06/05/16 (from the past 72 hour(s))  POCT Wet Prep Lenard Forth [place])     Status: Abnormal   Collection Time: 06/05/16  8:43 AM  Result Value Ref Range   Source Wet Prep POC VAGINAL    WBC, Wet Prep HPF POC None    Bacteria Wet Prep HPF POC Many (A) None, Few, Too numerous to count   BACTERIA WET PREP MORPHOLOGY POC     Clue Cells Wet Prep HPF POC Few (A) None, Too numerous to count   Clue Cells Wet Prep Whiff POC Positive Whiff    Yeast Wet Prep HPF POC None    KOH Wet Prep POC None    Trichomonas Wet Prep HPF POC Absent Absent    No results found.   ASSESSMENT/PLAN: Refilled metronidazole, patient tends to get yeast infections on that, refilled the Diflucan regimen that she was on prior when she was taking the metronidazole. We'll go ahead and send the urine as well for gonorrhea/chlamydia, no urinary tract infection symptoms other than increased frequency, advised on UTI precautions, will test HIV if (+)GC/CT pt declines for now    BV (bacterial vaginosis)  Vaginal discharge - Plan: POCT Wet Prep ([place]), metroNIDAZOLE (FLAGYL) 500 MG tablet, fluconazole (DIFLUCAN) 200 MG tablet, GC/chlamydia probe amp, urine     Visit summary with medication list and pertinent instructions was printed for patient to review. All questions at time of visit were answered - patient instructed to contact office with any additional concerns. ER/RTC precautions were reviewed with the patient. Follow-up plan: Return if symptoms worsen or fail to improve.

## 2016-06-05 NOTE — Patient Instructions (Signed)
     IF you received an x-ray today, you will receive an invoice from [facility]. Please contact [facility] at [phone] with questions or concerns regarding your invoice.   IF you received labwork today, you will receive an invoice from [facility]. Please contact [facility] at [phone] with questions or concerns regarding your invoice.   Our billing staff will not be able to assist you with questions regarding bills from these companies.  You will be contacted with the lab results as soon as they are available. The fastest way to get your results is to activate your My Chart account. Instructions are located on the last page of this paperwork. If you have not heard from us regarding the results in 2 weeks, please contact this office.      

## 2016-06-07 LAB — GC/CHLAMYDIA PROBE AMP
CT PROBE, AMP APTIMA: NOT DETECTED
GC Probe RNA: NOT DETECTED

## 2016-06-15 ENCOUNTER — Other Ambulatory Visit: Payer: Self-pay | Admitting: Physician Assistant

## 2016-06-15 ENCOUNTER — Encounter: Payer: Self-pay | Admitting: Physician Assistant

## 2016-06-15 NOTE — Telephone Encounter (Signed)
Patient was seen 8/5  For BV no mention of valtrex.  Last ordered back in 04/25/15 by outside provider.  Can we refills

## 2016-06-16 MED ORDER — VALACYCLOVIR HCL 500 MG PO TABS: ORAL_TABLET | ORAL | 0 refills | Status: DC

## 2016-06-18 ENCOUNTER — Other Ambulatory Visit: Payer: Self-pay | Admitting: Osteopathic Medicine

## 2016-06-18 ENCOUNTER — Encounter: Payer: Self-pay | Admitting: Osteopathic Medicine

## 2016-06-18 DIAGNOSIS — N898 Other specified noninflammatory disorders of vagina: Secondary | ICD-10-CM

## 2016-06-18 MED ORDER — FLUCONAZOLE 150 MG PO TABS: 150.0000 mg | ORAL_TABLET | Freq: Once | ORAL | 0 refills | Status: AC

## 2016-06-18 MED FILL — FLUCONAZOLE 150 MG TABLET: 150 | 3 days supply | Qty: 2 | Fill #0

## 2016-06-18 MED FILL — VALACYCLOVIR HCL 500 MG TAB: 500 | 30 days supply | Qty: 60 | Fill #0

## 2016-07-24 ENCOUNTER — Ambulatory Visit (INDEPENDENT_AMBULATORY_CARE_PROVIDER_SITE_OTHER): Payer: BLUE CROSS/BLUE SHIELD | Admitting: Family Medicine

## 2016-07-24 DIAGNOSIS — N898 Other specified noninflammatory disorders of vagina: Secondary | ICD-10-CM | POA: Diagnosis not present

## 2016-07-24 MED ORDER — FLUCONAZOLE 150 MG PO TABS: 150.0000 mg | ORAL_TABLET | Freq: Every day | ORAL | 0 refills | Status: DC

## 2016-07-24 MED ORDER — METRONIDAZOLE 500 MG PO TABS: 500.0000 mg | ORAL_TABLET | Freq: Two times a day (BID) | ORAL | 0 refills | Status: DC

## 2016-07-24 NOTE — Patient Instructions (Signed)
Thank you for coming in today. Continue usual treatment for BV.  Ask about boric acid.  Return as needed.

## 2016-07-24 NOTE — Progress Notes (Signed)
Carrie Romero is a 45 y.o. female who presents to [facility] today for vaginal discharge and itching. Patient is a several day history of vaginal discharge and itching. She has had multiple episodes of recurrent bacterial vaginosis. She was seen about 2 months ago for the same problem and treated with metronidazole and fluconazole which worked well. She has not had sexual contact since her last treatment.   Past Medical History:  Diagnosis Date  . Allergy   . Depression   . GERD (gastroesophageal reflux disease)   . Hyperlipidemia   . 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 Migraine   . Sickle cell anemia (HCC)    Trait only   Past Surgical History:  Procedure Laterality Date  . CESAREAN SECTION  1996  . CHOLECYSTECTOMY  1993  . COMBINED HYSTEROSCOPY DIAGNOSTIC / D&C  05/2010  . MYOMECTOMY     2011  . TOTAL LAPR.HYSTERECTOMY  04/2011   DR.[place]  . TUBAL LIGATION  1996   Social History  Substance Use Topics  . Smoking status: Never Smoker  . Smokeless tobacco: Never Used  . Alcohol use 4.2 oz/week    7 Standard drinks or equivalent per week   family history includes Arthritis in her mother; Breast cancer in her maternal aunt; Breast cancer (age of onset: 47) in her cousin; Cancer in her mother and sister; Diabetes in her father and mother; Heart attack in her sister; Heart disease in her father; Hyperlipidemia in her father; Hypertension in her mother; Ovarian cancer in her sister; Thyroid cancer in her sister; Thyroid disease in her mother.  ROS as above:  Medications: Current Outpatient Prescriptions  Medication Sig Dispense Refill  . ALPRAZolam (XANAX) 0.5 MG tablet Take 1 tablet (0.5 mg total) by mouth 2 (two) times daily as needed. for anxiety 60 tablet 2  . CALCIUM PO Take 1,000 mg by mouth daily. 1000MG       . cetirizine (ZYRTEC) 10 MG tablet Take 10 mg by mouth 2 (two) times daily.    Marland Kitchen MAGNESIUM PO Take 1 tablet by mouth daily.     . valACYclovir (VALTREX) 500 MG tablet TAKE 1 TABLET BY MOUTH TWICE DAILY FOR 5 DAYS WITH OUTBREAKS 90 tablet 0  . fluconazole (DIFLUCAN) 150 MG tablet Take 1 tablet (150 mg total) by mouth daily. 7 tablet 0  . metroNIDAZOLE (FLAGYL) 500 MG tablet Take 1 tablet (500 mg total) by mouth 2 (two) times daily. For 7 days.  Avoid alcohol while taking 14 tablet 0   No current facility-administered medications for this visit.    Allergies  Allergen Reactions  . Other Anaphylaxis    cantalope  . Wasp Venom Anaphylaxis  . Hydrocodone Itching     Exam:  BP 130/82   Pulse 70   Temp 98.4 F (36.9 C) (Oral)   Resp 16   Ht 5\' 2"  (1.575 m)   Wt 159 lb (72.1 kg)   LMP 10/15/2010   SpO2 98%   BMI 29.08 kg/m  Gen: Well NAD HEENT: EOMI,  MMM Lungs: Normal work of breathing. CTABL Heart: RRR no MRG Abd: NABS, Soft. Nondistended, Nontender Exts: Brisk capillary refill, warm and well perfused. Genital: Exam deferred   No results found for this or any previous visit (from the past 24 hour(s)). No results found.    Assessment and Plan: 45 y.o. female with recurrent BV. Very likely. Patient deferred  exam today. Plan to repeat treatment with metronidazole and fluconazole. Follow-up with OB/GYN later next month.   No orders of the defined types were placed in this encounter.   Discussed warning signs or symptoms. Please see discharge instructions. Patient expresses understanding.

## 2016-07-28 MED FILL — VALACYCLOVIR HCL 500 MG TAB: 500 | 15 days supply | Qty: 30 | Fill #1

## 2016-07-29 ENCOUNTER — Encounter: Payer: Self-pay | Admitting: Family Medicine

## 2016-08-02 ENCOUNTER — Telehealth: Payer: Self-pay | Admitting: *Deleted

## 2016-08-02 NOTE — Telephone Encounter (Signed)
Pt has annual scheduled on 08/26/16 would like to have labs done prior to annual. Please advise

## 2016-08-02 NOTE — Telephone Encounter (Signed)
okay for CBC, comprehensive metabolic panel, lipid profile, TSH, vitamin D, urinalysis

## 2016-08-04 NOTE — Telephone Encounter (Signed)
Dr Tamala Julian, I wasn't sure who to forward this to for Dr Clovis Riley pt who saw him here. See also phone message from 10/2 in EPIC from GYN office

## 2016-08-04 NOTE — Telephone Encounter (Signed)
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 Order faxed to [phone]. Pt said she is established at urgent care already.

## 2016-08-06 ENCOUNTER — Other Ambulatory Visit: Payer: Self-pay | Admitting: Family Medicine

## 2016-08-06 DIAGNOSIS — G43709 Chronic migraine without aura, not intractable, without status migrainosus: Secondary | ICD-10-CM

## 2016-08-06 DIAGNOSIS — F329 Major depressive disorder, single episode, unspecified: Secondary | ICD-10-CM

## 2016-08-06 DIAGNOSIS — Z114 Encounter for screening for human immunodeficiency virus [HIV]: Secondary | ICD-10-CM

## 2016-08-06 DIAGNOSIS — Z131 Encounter for screening for diabetes mellitus: Secondary | ICD-10-CM

## 2016-08-06 DIAGNOSIS — Z13220 Encounter for screening for lipoid disorders: Secondary | ICD-10-CM

## 2016-08-14 ENCOUNTER — Other Ambulatory Visit (INDEPENDENT_AMBULATORY_CARE_PROVIDER_SITE_OTHER): Payer: BLUE CROSS/BLUE SHIELD | Admitting: Family Medicine

## 2016-08-14 DIAGNOSIS — F329 Major depressive disorder, single episode, unspecified: Secondary | ICD-10-CM

## 2016-08-14 DIAGNOSIS — Z13220 Encounter for screening for lipoid disorders: Secondary | ICD-10-CM

## 2016-08-14 DIAGNOSIS — Z131 Encounter for screening for diabetes mellitus: Secondary | ICD-10-CM

## 2016-08-14 DIAGNOSIS — Z114 Encounter for screening for human immunodeficiency virus [HIV]: Secondary | ICD-10-CM

## 2016-08-14 LAB — CBC WITH DIFFERENTIAL/PLATELET
BASOS ABS: 39 {cells}/uL (ref 0–200)
Basophils Relative: 1 %
EOS ABS: 78 {cells}/uL (ref 15–500)
EOS PCT: 2 %
HCT: 36.6 % (ref 35.0–45.0)
Hemoglobin: 12.1 g/dL (ref 11.7–15.5)
LYMPHS ABS: 1872 {cells}/uL (ref 850–3900)
LYMPHS PCT: 48 %
MCH: 29.4 pg (ref 27.0–33.0)
MCHC: 33.1 g/dL (ref 32.0–36.0)
MCV: 88.8 fL (ref 80.0–100.0)
MONOS PCT: 11 %
MPV: 10 fL (ref 7.5–12.5)
Monocytes Absolute: 429 cells/uL (ref 200–950)
NEUTROS ABS: 1482 {cells}/uL — AB (ref 1500–7800)
Neutrophils Relative %: 38 %
PLATELETS: 320 10*3/uL (ref 140–400)
RBC: 4.12 MIL/uL (ref 3.80–5.10)
RDW: 13.9 % (ref 11.0–15.0)
WBC: 3.9 10*3/uL (ref 3.8–10.8)

## 2016-08-14 LAB — COMPREHENSIVE METABOLIC PANEL
ALT: 14 U/L (ref 6–29)
AST: 17 U/L (ref 10–35)
Albumin: 3.9 g/dL (ref 3.6–5.1)
Alkaline Phosphatase: 61 U/L (ref 33–115)
BUN: 9 mg/dL (ref 7–25)
CHLORIDE: 110 mmol/L (ref 98–110)
CO2: 22 mmol/L (ref 20–31)
CREATININE: 0.95 mg/dL (ref 0.50–1.10)
Calcium: 8.9 mg/dL (ref 8.6–10.2)
GLUCOSE: 91 mg/dL (ref 65–99)
POTASSIUM: 4.1 mmol/L (ref 3.5–5.3)
SODIUM: 141 mmol/L (ref 135–146)
Total Bilirubin: 0.2 mg/dL (ref 0.2–1.2)
Total Protein: 7.4 g/dL (ref 6.1–8.1)

## 2016-08-14 LAB — HIV ANTIBODY (ROUTINE TESTING W REFLEX): HIV: NONREACTIVE

## 2016-08-14 LAB — LIPID PANEL
CHOL/HDL RATIO: 4.3 ratio (ref <=5.0)
CHOLESTEROL: 239 mg/dL — AB (ref 125–200)
HDL: 56 mg/dL (ref >=46)
LDL CALC: 155 mg/dL — AB (ref <130)
Triglycerides: 142 mg/dL (ref <150)
VLDL: 28 mg/dL (ref <30)

## 2016-08-14 LAB — TSH: TSH: 1.16 mIU/L

## 2016-08-14 LAB — HEMOGLOBIN A1C
HEMOGLOBIN A1C: 5.6 % (ref <5.7)
Mean Plasma Glucose: 114 mg/dL

## 2016-08-16 LAB — VITAMIN D 25 HYDROXY (VIT D DEFICIENCY, FRACTURES): VIT D 25 HYDROXY: 28 ng/mL — AB (ref 30–100)

## 2016-08-19 ENCOUNTER — Telehealth: Payer: Self-pay

## 2016-08-19 ENCOUNTER — Other Ambulatory Visit: Payer: Self-pay | Admitting: Gynecology

## 2016-08-19 ENCOUNTER — Encounter: Payer: Self-pay | Admitting: Family Medicine

## 2016-08-19 DIAGNOSIS — E7800 Pure hypercholesterolemia, unspecified: Secondary | ICD-10-CM

## 2016-08-19 NOTE — Telephone Encounter (Signed)
Patient informed. Order and recall placed. 

## 2016-08-19 NOTE — Telephone Encounter (Signed)
Tell patient that the physician who ordered the lab work and signed off on it needs to release it. Her lab work does all look normal with the exception of a borderline elevated cholesterol and LDL. If this was fasting then she needs attention to diet and exercise with a repeat in 6 months. If it continues elevated she would need to follow up with her primary physician to discuss treatment options.

## 2016-08-19 NOTE — Telephone Encounter (Signed)
Patient called stating she had her lab work drawn at [facility] for her visit with you on 08/26/16.  She is asking me to release them in [facility] Chart so that she can see them.  Her Vit  D, Lipid Panel and CBC had abnormal values that you may want to address.   Please advise how you want me to proceed with this.

## 2016-08-22 NOTE — Progress Notes (Signed)
Lab visit only; no provider encounter. 

## 2016-08-26 ENCOUNTER — Telehealth: Payer: Self-pay | Admitting: *Deleted

## 2016-08-26 ENCOUNTER — Ambulatory Visit (INDEPENDENT_AMBULATORY_CARE_PROVIDER_SITE_OTHER): Payer: BLUE CROSS/BLUE SHIELD | Admitting: Gynecology

## 2016-08-26 ENCOUNTER — Encounter: Payer: Self-pay | Admitting: Gynecology

## 2016-08-26 VITALS — BP 116/74 | Ht 62.0 in | Wt 161.0 lb

## 2016-08-26 DIAGNOSIS — F419 Anxiety disorder, unspecified: Secondary | ICD-10-CM

## 2016-08-26 DIAGNOSIS — E559 Vitamin D deficiency, unspecified: Secondary | ICD-10-CM

## 2016-08-26 DIAGNOSIS — N760 Acute vaginitis: Secondary | ICD-10-CM

## 2016-08-26 DIAGNOSIS — Z01419 Encounter for gynecological examination (general) (routine) without abnormal findings: Secondary | ICD-10-CM

## 2016-08-26 LAB — WET PREP FOR TRICH, YEAST, CLUE: TRICH WET PREP: NONE SEEN

## 2016-08-26 MED ORDER — ALPRAZOLAM 0.5 MG PO TABS: 0.5000 mg | ORAL_TABLET | Freq: Two times a day (BID) | ORAL | 2 refills | Status: DC | PRN

## 2016-08-26 MED ORDER — NONFORMULARY OR COMPOUNDED ITEM: 1 refills | Status: DC

## 2016-08-26 MED ORDER — METRONIDAZOLE 500 MG PO TABS: 500.0000 mg | ORAL_TABLET | Freq: Two times a day (BID) | ORAL | 0 refills | Status: DC

## 2016-08-26 MED ORDER — FLUCONAZOLE 150 MG PO TABS: 150.0000 mg | ORAL_TABLET | Freq: Once | ORAL | 0 refills | Status: AC

## 2016-08-26 MED ORDER — VALACYCLOVIR HCL 500 MG PO TABS: ORAL_TABLET | ORAL | 1 refills | Status: DC

## 2016-08-26 MED FILL — FLUCONAZOLE 150 MG TABLET: 150 | 7 days supply | Qty: 7 | Fill #0

## 2016-08-26 MED FILL — metroNIDAZOLE 500 MG TABS: 500 | 7 days supply | Qty: 14 | Fill #0

## 2016-08-26 MED FILL — VALACYCLOVIR HCL 500 MG TAB: 500 | 15 days supply | Qty: 30 | Fill #0

## 2016-08-26 MED FILL — ALPRAZolam 0.5 MG TABS: 0.5 | 30 days supply | Qty: 60 | Fill #0

## 2016-08-26 NOTE — Telephone Encounter (Signed)
-----   Message from Anastasio Auerbach, MD sent at 08/26/2016 10:27 AM EDT ----- Check with [facility] to see if they do boric acid 600 mg vaginal suppositories. If so #30 one per vagina twice weekly with one refill. If not then [facility]. Let patient know where the prescription went to

## 2016-08-26 NOTE — Progress Notes (Signed)
Carrie Romero May 30, 1971 [id number]        45 y.o.  [id number]  for annual exam.  Several issues noted below.  Past medical history,surgical history, problem list, medications, allergies, family history and social history were all reviewed and documented as reviewed in the EPIC chart.  ROS:  Performed with pertinent positives and negatives included in the history, assessment and plan.   Additional significant findings :  None   Exam: [name] assistant Vitals:   08/26/16 0954  BP: 116/74  Weight: 161 lb (73 kg)  Height: 5\' 2"  (1.575 m)   Body mass index is 29.45 kg/m.  General appearance:  Normal affect, orientation and appearance. Skin: Grossly normal HEENT: Without gross lesions.  No cervical or supraclavicular adenopathy. Thyroid normal.  Lungs:  Clear without wheezing, rales or rhonchi Cardiac: RR, without RMG Abdominal:  Soft, nontender, without masses, guarding, rebound, organomegaly or hernia Breasts:  Examined lying and sitting without masses, retractions, discharge or axillary adenopathy. Pelvic:  Ext, BUS, Vagina with thick white discharge  Adnexa without masses or tenderness    Anus and perineum normal   Rectovaginal normal sphincter tone without palpated masses or tenderness.    Assessment/Plan:  44 y.o. [id number] female for annual exam. Status post [place] 2012 for menorrhagia  1. Recurrent vaginitis. Patient recently treated with metronidazole and Flagyl for vaginitis in September. Of note she was not examined. Does have a history of recurrent vaginitis in the past. Has white heavy discharge now and wet prep shows yeast and bacterial vaginosis. Will treat with Flagyl 500 mg twice a day 7 days, alcohol avoidance reviewed. Diflucan 150 mg daily 7 days to try to eradicate residual yeast i.e. GI tract. Discussed suppressive therapy options and recommend boric acid suppositories 600 mg intravaginal twice weekly over the next 6 months to see if this does not suppress  recurrences. I reviewed with her how to use the medication. She'll go ahead and start this and then follow up with me if she continues to have recurrences. 2. Pap smear 2016. No Pap smear done today. History of LGSIL 2011. Pathology from hysterectomy showed normal cervix 3. Mammography 2014. Patient knows she is overdo and I strongly recommended she call and schedule a mammogram. Names and numbers provided. SBE monthly reviewed. 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 #30 with 3 refills provided. 5. Anxiety. Patient uses Xanax 0.5 mg intermittently as needed for anxiety. Does well with this without significant side effects. Initially prescribed by her primary physician. #60 with 2 refills provided. 6. Vitamin D deficiency.  Recent vitamin D 28. Had been low previously I asked her to start on vitamin D but she has not done so. I strongly recommended she start on 2000 units OTC vitamin D daily. Possible contribution to her recurrent vaginitis discussed. Patient is going to go ahead and do so. 7. Recent lab work that showed normal glucose but elevated cholesterol at 239 and LDL at 155. HDL is 56. Of asked her to show these numbers to her primary physician for follow up and possible medication management. Patient agrees to do so. 8. Health maintenance. Patient will follow up with her primary physician for ongoing general health care. Will follow up with me if any continued GYN complaints.  Greater than 10 minutes of my time in excess of her routine gynecologic exam was spent in direct face to face counseling and coordination of care in regards to  her recurrent vaginitis and treatment regimens as well as discussion of her anxiety and treatment.    Anastasio Auerbach MD, 10:21 AM 08/26/2016

## 2016-08-26 NOTE — Patient Instructions (Signed)
Take the Flagyl medication twice daily for 7 days. Avoid alcohol while taking. Take the Diflucan pill daily for 7 days Start the boric acid suppositories in the vagina twice weekly and stay on these for several months to suppress recurrences. Take over-the-counter vitamin D 2000 units daily   Call to Schedule your mammogram  Facilities in [place]: 1)  [facility] [address] [place] Phone: [phone] 2)  Dr. Isaiah Blakes at [facility] [address] Phone: [phone]     Mammogram A mammogram is an X-ray test to find changes in a woman's breast. You should get a mammogram if:  You are 11 years of age or older  You have risk factors.   Your doctor recommends that you have one.  BEFORE THE TEST  Do not schedule the test the week before your period, especially if your breasts are sore during this time.  On the day of your mammogram:  Wash your breasts and armpits well. After washing, do not put on any deodorant or talcum powder on until after your test.   Eat and drink as you usually do.   Take your medicines as usual.   If you are diabetic and take insulin, make sure you:   Eat before coming for your test.   Take your insulin as usual.   If you cannot keep your appointment, call before the appointment to cancel. Schedule another appointment.  TEST  You will need to undress from the waist up. You will put on a hospital gown.   Your breast will be put on the mammogram machine, and it will press firmly on your breast with a piece of plastic called a compression paddle. This will make your breast flatter so that the machine can X-ray all parts of your breast.   Both breasts will be X-rayed. Each breast will be X-rayed from above and from the side. An X-ray might need to be taken again if the picture is not good enough.   The mammogram will last about 15 to 30 minutes.  AFTER THE TEST Finding  out the results of your test Ask when your test results will be ready. Make sure you get your test results.  Document Released: 01/14/2009 Document Revised: 10/07/2011 Document Reviewed: 01/14/2009 [facility] Patient Information 2012 [place].

## 2016-08-26 NOTE — Telephone Encounter (Signed)
Rx called in. Pt aware, [facility] does not compound drugs.

## 2016-08-27 LAB — URINALYSIS W MICROSCOPIC + REFLEX CULTURE
Bacteria, UA: NONE SEEN [HPF]
Bilirubin Urine: NEGATIVE
CASTS: NONE SEEN [LPF]
Crystals: NONE SEEN [HPF]
Glucose, UA: NEGATIVE
HGB URINE DIPSTICK: NEGATIVE
Leukocytes, UA: NEGATIVE
NITRITE: NEGATIVE
PH: 6.5 (ref 5.0–8.0)
Protein, ur: NEGATIVE
RBC / HPF: NONE SEEN RBC/HPF (ref <=2)
SQUAMOUS EPITHELIAL / LPF: NONE SEEN [HPF] (ref <=5)
Specific Gravity, Urine: 1.009 (ref 1.001–1.035)
WBC, UA: NONE SEEN WBC/HPF (ref <=5)
YEAST: NONE SEEN [HPF]

## 2016-10-22 MED FILL — ALPRAZolam 0.5 MG TABS: 0.5 | 30 days supply | Qty: 60 | Fill #1

## 2016-10-22 MED FILL — VALACYCLOVIR HCL 500 MG TAB: 500 | 15 days supply | Qty: 30 | Fill #1

## 2016-12-03 MED FILL — ALPRAZolam 0.5 MG TABS: 0.5 | 30 days supply | Qty: 60 | Fill #2

## 2016-12-03 MED FILL — VALACYCLOVIR HCL 500 MG TAB: 500 | 15 days supply | Qty: 30 | Fill #0

## 2017-01-18 ENCOUNTER — Ambulatory Visit (INDEPENDENT_AMBULATORY_CARE_PROVIDER_SITE_OTHER): Payer: BLUE CROSS/BLUE SHIELD | Admitting: Family Medicine

## 2017-01-18 ENCOUNTER — Encounter: Payer: Self-pay | Admitting: Emergency Medicine

## 2017-01-18 VITALS — BP 118/77 | HR 95 | Temp 98.1°F | Resp 17 | Ht 62.0 in | Wt 161.0 lb

## 2017-01-18 DIAGNOSIS — E049 Nontoxic goiter, unspecified: Secondary | ICD-10-CM

## 2017-01-18 DIAGNOSIS — J0191 Acute recurrent sinusitis, unspecified: Secondary | ICD-10-CM

## 2017-01-18 DIAGNOSIS — J3089 Other allergic rhinitis: Secondary | ICD-10-CM | POA: Diagnosis not present

## 2017-01-18 DIAGNOSIS — Z808 Family history of malignant neoplasm of other organs or systems: Secondary | ICD-10-CM | POA: Diagnosis not present

## 2017-01-18 MED ORDER — FLUTICASONE PROPIONATE 50 MCG/ACT NA SUSP: 2.0000 | Freq: Every day | NASAL | 2 refills | Status: DC

## 2017-01-18 MED ORDER — AMOXICILLIN-POT CLAVULANATE 875-125 MG PO TABS: 1.0000 | ORAL_TABLET | Freq: Two times a day (BID) | ORAL | 0 refills | Status: DC

## 2017-01-18 MED ORDER — PREDNISONE 20 MG PO TABS: ORAL_TABLET | ORAL | 0 refills | Status: DC

## 2017-01-18 MED FILL — predniSONE 20 MG TABS: 20 | 9 days supply | Qty: 18 | Fill #0

## 2017-01-18 MED FILL — AMOX-CLAV 875-125 MG TABLET: 875-125 | 10 days supply | Qty: 20 | Fill #0

## 2017-01-18 MED FILL — FLUTICASONE PROP 50 MCG SPR: 50 | 30 days supply | Qty: 16 | Fill #0

## 2017-01-18 NOTE — Patient Instructions (Addendum)
Hot showers or breathing in steam may help loosen the congestion.  Using a netti pot or sinus rinse is also likely to help you feel better and keep this from progressing.  Use nasal saline spray or gel as needed throughout the day - at least four times a day minimum and use the fluticasone nasal spray every night before bed for at least 2 weeks.  I recommend augmenting with 12 hr sudafed (behind the counter) or generic mucinex to help you move out the congestion.  If no improvement or you are getting worse, come back but hopefully with all of the above, you can avoid it.    IF you received an x-ray today, you will receive an invoice from [facility]. Please contact [facility] at [phone] with questions or concerns regarding your invoice.   IF you received labwork today, you will receive an invoice from [facility]. Please contact [facility] at [phone] with questions or concerns regarding your invoice.   Our billing staff will not be able to assist you with questions regarding bills from these companies.  You will be contacted with the lab results as soon as they are available. The fastest way to get your results is to activate your My Chart account. Instructions are located on the last page of this paperwork. If you have not heard from [place] regarding the results in 2 weeks, please contact this office.      Sinusitis, Adult Sinusitis is soreness and inflammation of your sinuses. Sinuses are hollow spaces in the bones around your face. Your sinuses are located:  Around your eyes.  In the middle of your forehead.  Behind your nose.  In your cheekbones. Your sinuses and nasal passages are lined with a stringy fluid (mucus). Mucus normally drains out of your sinuses. When your nasal tissues become inflamed or swollen, the mucus can become trapped or blocked so air cannot flow through your sinuses. This allows bacteria, viruses, and funguses to grow, which leads to  infection. Sinusitis can develop quickly and last for 7?10 days (acute) or for more than 12 weeks (chronic). Sinusitis often develops after a cold. What are the causes? This condition is caused by anything that creates swelling in the sinuses or stops mucus from draining, including:  Allergies.  Asthma.  Bacterial or viral infection.  Abnormally shaped bones between the nasal passages.  Nasal growths that contain mucus (nasal polyps).  Narrow sinus openings.  Pollutants, such as chemicals or irritants in the air.  A foreign object stuck in the nose.  A fungal infection. This is rare. What increases the risk? The following factors may make you more likely to develop this condition:  Having allergies or asthma.  Having had a recent cold or respiratory tract infection.  Having structural deformities or blockages in your nose or sinuses.  Having a weak immune system.  Doing a lot of swimming or diving.  Overusing nasal sprays.  Smoking. What are the signs or symptoms? The main symptoms of this condition are pain and a feeling of pressure around the affected sinuses. Other symptoms include:  Upper toothache.  Earache.  Headache.  Bad breath.  Decreased sense of smell and taste.  A cough that may get worse at night.  Fatigue.  Fever.  Thick drainage from your nose. The drainage is often green and it may contain pus (purulent).  Stuffy nose or congestion.  Postnasal drip. This is when extra mucus collects in the throat or back of the nose.  Swelling and warmth over the affected sinuses.  Sore throat.  Sensitivity to light. How is this diagnosed? This condition is diagnosed based on symptoms, a medical history, and a physical exam. To find out if your condition is acute or chronic, your health care provider may:  Look in your nose for signs of nasal polyps.  Tap over the affected sinus to check for signs of infection.  View the inside of your sinuses  using an imaging device that has a light attached (endoscope). If your health care provider suspects that you have chronic sinusitis, you may also:  Be tested for allergies.  Have a sample of mucus taken from your nose (nasal culture) and checked for bacteria.  Have a mucus sample examined to see if your sinusitis is related to an allergy. If your sinusitis does not respond to treatment and it lasts longer than 8 weeks, you may have an MRI or CT scan to check your sinuses. These scans also help to determine how severe your infection is. In rare cases, a bone biopsy may be done to rule out more serious types of fungal sinus disease. How is this treated? Treatment for sinusitis depends on the cause and whether your condition is chronic or acute. If a virus is causing your sinusitis, your symptoms will go away on their own within 10 days. You may be given medicines to relieve your symptoms, including:  Topical nasal decongestants. They shrink swollen nasal passages and let mucus drain from your sinuses.  Antihistamines. These drugs block inflammation that is triggered by allergies. This can help to ease swelling in your nose and sinuses.  Topical nasal corticosteroids. These are nasal sprays that ease inflammation and swelling in your nose and sinuses.  Nasal saline washes. These rinses can help to get rid of thick mucus in your nose. If your condition is caused by bacteria, you will be given an antibiotic medicine. If your condition is caused by a fungus, you will be given an antifungal medicine. Surgery may be needed to correct underlying conditions, such as narrow nasal passages. Surgery may also be needed to remove polyps. Follow these instructions at home: Medicines   Take, use, or apply over-the-counter and prescription medicines only as told by your health care provider. These may include nasal sprays.  If you were prescribed an antibiotic medicine, take it as told by your health care  provider. Do not stop taking the antibiotic even if you start to feel better. Hydrate and Humidify   Drink enough water to keep your urine clear or pale yellow. Staying hydrated will help to thin your mucus.  Use a cool mist humidifier to keep the humidity level in your home above 50%.  Inhale steam for 10-15 minutes, 3-4 times a day or as told by your health care provider. You can do this in the bathroom while a hot shower is running.  Limit your exposure to cool or dry air. Rest   Rest as much as possible.  Sleep with your head raised (elevated).  Make sure to get enough sleep each night. General instructions   Apply a warm, moist washcloth to your face 3-4 times a day or as told by your health care provider. This will help with discomfort.  Wash your hands often with soap and water to reduce your exposure to viruses and other germs. If soap and water are not available, use hand sanitizer.  Do not smoke. Avoid being around people who are smoking (secondhand smoke).  Keep all follow-up visits as told by your health care provider. This is important. Contact a health care provider if:  You have a fever.  Your symptoms get worse.  Your symptoms do not improve within 10 days. Get help right away if:  You have a severe headache.  You have persistent vomiting.  You have pain or swelling around your face or eyes.  You have vision problems.  You develop confusion.  Your neck is stiff.  You have trouble breathing. This information is not intended to replace advice given to you by your health care provider. Make sure you discuss any questions you have with your health care provider. Document Released: 10/18/2005 Document Revised: 06/13/2016 Document Reviewed: 08/13/2015 Elsevier Interactive Patient Education  2017 [employer].

## 2017-01-18 NOTE — Progress Notes (Signed)
Subjective:  By signing my name below, I, Essence Howell, attest that this documentation has been prepared under the direction and in the presence of Delman Cheadle, MD Electronically Signed: Ladene Artist, ED Scribe 01/18/2017 at 10:57 AM.   Patient ID: Carrie Romero, female    DOB: 02-28-1971, 46 y.o.   MRN: [id number]  Chief Complaint  Patient presents with  . Cough  . URI   HPI Carrie Romero is a 46 y.o. female who presents to Primary Care at [facility] complaining of dry cough for the past 3 week. Pt reports associated symptoms of bilateral epistaxis for the past 1.5 weeks that resolve with pinching her nose and applying ice, post-tussive emesis, postnasal drip, nasal congestion, occasional light colored sputum with streaks of blood, and sinus pressure onset 4 AM this morning. Pt has tried Mucinex twice daily for 3 weeks and she takes Zyrtec year around. No recent antibiotic use. Pt denies h/o smoking or asthma.   Past Medical History:  Diagnosis Date  . Allergy   . Depression   . GERD (gastroesophageal reflux disease)   . Hyperlipidemia   . 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 Migraine   . Sickle cell anemia (HCC)    Trait only   Current Outpatient Prescriptions on File Prior to Visit  Medication Sig Dispense Refill  . ALPRAZolam (XANAX) 0.5 MG tablet Take 1 tablet (0.5 mg total) by mouth 2 (two) times daily as needed. for anxiety 60 tablet 2  . CALCIUM PO Take 1,000 mg by mouth daily. 1000MG     . cetirizine (ZYRTEC) 10 MG tablet Take 10 mg by mouth 2 (two) times daily.    Marland Kitchen MAGNESIUM PO Take 1 tablet by mouth daily.     . NONFORMULARY OR COMPOUNDED ITEM boric acid 600 mg vaginal suppositories one per vagina twice weekly 30 each 1  . valACYclovir (VALTREX) 500 MG tablet TAKE 1 TABLET BY MOUTH TWICE DAILY FOR 5 DAYS WITH OUTBREAKS 30 tablet 1  . metroNIDAZOLE (FLAGYL) 500 MG tablet Take 1 tablet (500 mg total) by mouth 2 (two) times daily. For 7 days.  Avoid  alcohol while taking (Patient not taking: Reported on 01/18/2017) 14 tablet 0   No current facility-administered medications on file prior to visit.    Allergies  Allergen Reactions  . Other Anaphylaxis    cantalope  . Wasp Venom Anaphylaxis  . Hydrocodone Itching   Review of Systems  HENT: Positive for congestion, nosebleeds, postnasal drip and sinus pressure.   Respiratory: Positive for cough.       Objective:   Physical Exam  Constitutional: She is oriented to person, place, and time. She appears well-developed and well-nourished. No distress.  HENT:  Head: Normocephalic and atraumatic.  Right Ear: Tympanic membrane is injected and retracted. A middle ear effusion is present.  Mouth/Throat: Oropharynx is clear and moist.  L TM: cerumen impaction.  No friable areas of nasal mucosa visible. Pale boggy turbinates.   Eyes: Conjunctivae and EOM are normal.  Neck: Neck supple. No tracheal deviation present. Thyromegaly present.  Cardiovascular: Normal rate, regular rhythm, S1 normal, S2 normal and normal heart sounds.   Pulmonary/Chest: Effort normal. No respiratory distress.  Lungs are clear to auscultation.   Musculoskeletal: Normal range of motion.  Lymphadenopathy:       Head (right side): Submandibular and tonsillar adenopathy present.       Head (left side): Submandibular and tonsillar adenopathy present.    She has cervical  adenopathy (anterior).  Neurological: She is alert and oriented to person, place, and time.  Skin: Skin is warm and dry.  Psychiatric: She has a normal mood and affect. Her behavior is normal.  Nursing note and vitals reviewed.  BP 118/77   Pulse 95   Temp 98.1 F (36.7 C) (Oral)   Resp 17   Ht 5\' 2"  (1.575 m)   Wt 161 lb (73 kg)   LMP 10/15/2010   SpO2 97%   BMI 29.45 kg/m     Assessment & Plan:   1. Acute recurrent sinusitis, unspecified location   2. Chronic non-seasonal allergic rhinitis, unspecified trigger   3. Enlarged thyroid -  nml tsh w/ cpe labs last yr. Recheck full panel - pt will make an appt for this or can RTC for lab only in 6-8 wks (after feeling better).  4. Family history of thyroid cancer - in mother and sister in their 60s yo so will proceed with [place] for enlarge thyroid gland.    Orders Placed This Encounter  Procedures  . US THYROID    EPIC ORDER    Standing Status:   Future    Standing Expiration Date:   03/20/2018    Order Specific Question:   Reason for Exam (SYMPTOM  OR DIAGNOSIS REQUIRED)    Answer:   enlarged thyroid, + family history of thyroid cancer in mother and sister in 50s    Order Specific Question:   Preferred imaging location?    Answer:   [facility]    Meds ordered this encounter  Medications  . predniSONE (DELTASONE) 20 MG tablet    Sig: Take 3 tabs qd x 3d, then 2 tabs qd x 3d then 1 tab qd x 3d.    Dispense:  18 tablet    Refill:  0  . amoxicillin-clavulanate (AUGMENTIN) 875-125 MG tablet    Sig: Take 1 tablet by mouth 2 (two) times daily.    Dispense:  20 tablet    Refill:  0  . fluticasone (FLONASE) 50 MCG/ACT nasal spray    Sig: Place 2 sprays into both nostrils at bedtime.    Dispense:  16 g    Refill:  2    I personally performed the services described in this documentation, which was scribed in my presence. The recorded information has been reviewed and considered, and addended by me as needed.   Delman Cheadle, M.D.  Primary Care at [facility] [address] [place], [place] [address] [phone] phone [phone] fax  01/20/17 11:13 PM

## 2017-01-28 MED FILL — VALACYCLOVIR HCL 500 MG TAB: 500 | 15 days supply | Qty: 30 | Fill #1

## 2017-02-07 ENCOUNTER — Other Ambulatory Visit: Payer: Self-pay | Admitting: Gynecology

## 2017-02-07 DIAGNOSIS — Z1231 Encounter for screening mammogram for malignant neoplasm of breast: Secondary | ICD-10-CM

## 2017-03-07 ENCOUNTER — Other Ambulatory Visit: Payer: Self-pay | Admitting: Gynecology

## 2017-03-07 DIAGNOSIS — F419 Anxiety disorder, unspecified: Secondary | ICD-10-CM

## 2017-03-07 MED FILL — VALACYCLOVIR HCL 500 MG TAB: 500 | 15 days supply | Qty: 30 | Fill #2

## 2017-03-07 MED FILL — ALPRAZolam 0.5 MG TABS: 0.5 | 30 days supply | Qty: 60 | Fill #0

## 2017-03-07 NOTE — Telephone Encounter (Signed)
Called into pharmacy

## 2017-03-11 ENCOUNTER — Ambulatory Visit
Admission: RE | Admit: 2017-03-11 | Discharge: 2017-03-11 | Disposition: A | Discharge status: Home / Self Care | Payer: BLUE CROSS/BLUE SHIELD | Source: Ambulatory Visit | Attending: Gynecology | Admitting: Gynecology

## 2017-03-11 DIAGNOSIS — Z1231 Encounter for screening mammogram for malignant neoplasm of breast: Secondary | ICD-10-CM

## 2017-03-14 ENCOUNTER — Ambulatory Visit
Admission: RE | Admit: 2017-03-14 | Discharge: 2017-03-14 | Disposition: A | Discharge status: Home / Self Care | Payer: BLUE CROSS/BLUE SHIELD | Source: Ambulatory Visit | Attending: Family Medicine | Admitting: Family Medicine

## 2017-03-14 ENCOUNTER — Other Ambulatory Visit: Payer: Self-pay | Admitting: Gynecology

## 2017-03-14 DIAGNOSIS — R928 Other abnormal and inconclusive findings on diagnostic imaging of breast: Secondary | ICD-10-CM

## 2017-03-14 DIAGNOSIS — E049 Nontoxic goiter, unspecified: Secondary | ICD-10-CM

## 2017-03-14 DIAGNOSIS — Z808 Family history of malignant neoplasm of other organs or systems: Secondary | ICD-10-CM

## 2017-03-17 ENCOUNTER — Telehealth: Payer: Self-pay | Admitting: Family Medicine

## 2017-03-17 ENCOUNTER — Encounter: Payer: Self-pay | Admitting: Family Medicine

## 2017-03-17 NOTE — Telephone Encounter (Signed)
Sent to MyChart

## 2017-03-17 NOTE — Telephone Encounter (Signed)
Pt is looking for her ultrasound results  Best number [phone]

## 2017-03-17 NOTE — Telephone Encounter (Signed)
Please see results.

## 2017-03-27 ENCOUNTER — Encounter: Payer: Self-pay | Admitting: Family Medicine

## 2017-04-04 ENCOUNTER — Ambulatory Visit (INDEPENDENT_AMBULATORY_CARE_PROVIDER_SITE_OTHER): Payer: BLUE CROSS/BLUE SHIELD | Admitting: Family Medicine

## 2017-04-04 ENCOUNTER — Ambulatory Visit
Admission: RE | Admit: 2017-04-04 | Discharge: 2017-04-04 | Disposition: A | Discharge status: Home / Self Care | Payer: BLUE CROSS/BLUE SHIELD | Source: Ambulatory Visit | Attending: Gynecology | Admitting: Gynecology

## 2017-04-04 ENCOUNTER — Other Ambulatory Visit: Payer: Self-pay

## 2017-04-04 ENCOUNTER — Encounter: Payer: Self-pay | Admitting: Family Medicine

## 2017-04-04 ENCOUNTER — Other Ambulatory Visit: Payer: Self-pay | Admitting: Gynecology

## 2017-04-04 VITALS — BP 127/80 | HR 85 | Temp 98.4°F | Resp 16 | Ht 62.0 in | Wt 162.0 lb

## 2017-04-04 DIAGNOSIS — F419 Anxiety disorder, unspecified: Secondary | ICD-10-CM

## 2017-04-04 DIAGNOSIS — Z113 Encounter for screening for infections with a predominantly sexual mode of transmission: Secondary | ICD-10-CM | POA: Diagnosis not present

## 2017-04-04 DIAGNOSIS — Z1231 Encounter for screening mammogram for malignant neoplasm of breast: Secondary | ICD-10-CM | POA: Diagnosis not present

## 2017-04-04 DIAGNOSIS — Z1383 Encounter for screening for respiratory disorder NEC: Secondary | ICD-10-CM | POA: Diagnosis not present

## 2017-04-04 DIAGNOSIS — E559 Vitamin D deficiency, unspecified: Secondary | ICD-10-CM | POA: Diagnosis not present

## 2017-04-04 DIAGNOSIS — Z0000 Encounter for general adult medical examination without abnormal findings: Secondary | ICD-10-CM | POA: Diagnosis not present

## 2017-04-04 DIAGNOSIS — Z136 Encounter for screening for cardiovascular disorders: Secondary | ICD-10-CM

## 2017-04-04 DIAGNOSIS — Z1389 Encounter for screening for other disorder: Secondary | ICD-10-CM | POA: Diagnosis not present

## 2017-04-04 DIAGNOSIS — E663 Overweight: Secondary | ICD-10-CM

## 2017-04-04 DIAGNOSIS — E049 Nontoxic goiter, unspecified: Secondary | ICD-10-CM | POA: Diagnosis not present

## 2017-04-04 DIAGNOSIS — Z130 Encounter for screening for diseases of the blood and blood-forming organs and certain disorders involving the immune mechanism: Secondary | ICD-10-CM | POA: Diagnosis not present

## 2017-04-04 DIAGNOSIS — Z1329 Encounter for screening for other suspected endocrine disorder: Secondary | ICD-10-CM | POA: Diagnosis not present

## 2017-04-04 DIAGNOSIS — G43109 Migraine with aura, not intractable, without status migrainosus: Secondary | ICD-10-CM | POA: Diagnosis not present

## 2017-04-04 DIAGNOSIS — R928 Other abnormal and inconclusive findings on diagnostic imaging of breast: Secondary | ICD-10-CM

## 2017-04-04 DIAGNOSIS — N6320 Unspecified lump in the left breast, unspecified quadrant: Secondary | ICD-10-CM

## 2017-04-04 DIAGNOSIS — Z808 Family history of malignant neoplasm of other organs or systems: Secondary | ICD-10-CM | POA: Diagnosis not present

## 2017-04-04 LAB — POCT URINALYSIS DIP (MANUAL ENTRY)
Bilirubin, UA: NEGATIVE
GLUCOSE UA: NEGATIVE mg/dL
Ketones, POC UA: NEGATIVE mg/dL
LEUKOCYTES UA: NEGATIVE
NITRITE UA: NEGATIVE
Protein Ur, POC: NEGATIVE mg/dL
RBC UA: NEGATIVE
Spec Grav, UA: 1.01 (ref 1.010–1.025)
UROBILINOGEN UA: 0.2 U/dL
pH, UA: 5.5 (ref 5.0–8.0)

## 2017-04-04 MED ORDER — TOPIRAMATE 25 MG PO TABS: ORAL_TABLET | ORAL | 0 refills | Status: DC

## 2017-04-04 MED ORDER — SUMATRIPTAN SUCCINATE 100 MG PO TABS: 100.0000 mg | ORAL_TABLET | ORAL | 11 refills | Status: DC | PRN

## 2017-04-04 NOTE — Patient Instructions (Addendum)
IF you received an x-ray today, you will receive an invoice from [facility]. Please contact [facility] at [phone] with questions or concerns regarding your invoice.   IF you received labwork today, you will receive an invoice from [place]. Please contact [facility] at [phone] with questions or concerns regarding your invoice.   Our billing staff will not be able to assist you with questions regarding bills from these companies.  You will be contacted with the lab results as soon as they are available. The fastest way to get your results is to activate your My Chart account. Instructions are located on the last page of this paperwork. If you have not heard from [place] regarding the results in 2 weeks, please contact this office.     Preventing Unhealthy Weight Gain, Adult Staying at a healthy weight is important. When fat builds up in your body, you may become overweight or obese. These conditions put you at greater risk for developing certain health problems, such as heart disease, diabetes, sleeping problems, joint problems, and some cancers. Unhealthy weight gain is often the result of making unhealthy choices in what you eat. It is also a result of not getting enough exercise. You can make changes to your lifestyle to prevent obesity and stay as healthy as possible. What nutrition changes can be made? To maintain a healthy weight and prevent obesity:  Eat only as much as your body needs. To do this: ? Pay attention to signs that you are hungry or full. Stop eating as soon as you feel full. ? If you feel hungry, try drinking water first. Drink enough water so your urine is clear or pale yellow. ? Eat smaller portions. ? Look at serving sizes on food labels. Most foods contain more than one serving per container. ? Eat the recommended amount of calories for your gender and activity level. While most active people should eat around 2,000 calories per day, if you  are trying to lose weight or are not very active, you main need to eat less calories. Talk to your health care provider or dietitian about how many calories you should eat each day.  Choose healthy foods, such as: ? Fruits and vegetables. Try to fill at least half of your plate at each meal with fruits and vegetables. ? Whole grains, such as whole wheat bread, brown rice, and quinoa. ? Lean meats, such as chicken or fish. ? Other healthy proteins, such as beans, eggs, or tofu. ? Healthy fats, such as nuts, seeds, fatty fish, and olive oil. ? Low-fat or fat-free dairy.  Check food labels and avoid food and drinks that: ? Are high in calories. ? Have added sugar. ? Are high in sodium. ? Have saturated fats or trans fats.  Limit how much you eat of the following foods: ? Prepackaged meals. ? Fast food. ? Fried foods. ? Processed meat, such as bacon, sausage, and deli meats. ? Fatty cuts of red meat and poultry with skin.  Cook foods in healthier ways, such as by baking, broiling, or grilling.  When grocery shopping, try to shop around the outside of the store. This helps you buy mostly fresh foods and avoid canned and prepackaged foods.  What lifestyle changes can be made?  Exercise at least 30 minutes 5 or more days each week. Exercising includes brisk walking, yard work, biking, running, swimming, and team sports like basketball and soccer. Ask your health care provider which exercises are safe for you.  not use any products that contain nicotine or tobacco, such as cigarettes and e-cigarettes. If you need help quitting, ask your health care provider.  Limit alcohol intake to no more than 1 drink a day for nonpregnant women and 2 drinks a day for men. One drink equals 12 oz of beer, 5 oz of wine, or 1 oz of hard liquor.  Try to get 7-9 hours of sleep each night. What other changes can be made?  Keep a food and activity journal to keep track of: ? What you ate and how many  calories you had. Remember to count sauces, dressings, and side dishes. ? Whether you were active, and what exercises you did. ? Your calorie, weight, and activity goals.  Check your weight regularly. Track any changes. If you notice you have gained weight, make changes to your diet or activity routine.  Avoid taking weight-loss medicines or supplements. Talk to your health care provider before starting any new medicine or supplement.  Talk to your health care provider before trying any new diet or exercise plan. Why are these changes important? Eating healthy, staying active, and having healthy habits not only help prevent obesity, they also:  Help you to manage stress and emotions.  Help you to connect with friends and family.  Improve your self-esteem.  Improve your sleep.  Prevent long-term health problems.  What can happen if changes are not made? Being obese or overweight can cause you to develop joint or bone problems, which can make it hard for you to stay active or do activities you enjoy. Being obese or overweight also puts stress on your heart and lungs and can lead to health problems like diabetes, heart disease, and some cancers. Where to find more information: Talk with your health care provider or a dietitian about healthy eating and healthy lifestyle choices. You may also find other information through these resources:  U.S. Department of Agriculture MyPlate: www.choosemyplate.gov  American Heart Association: www.heart.org  Centers for Disease Control and Prevention: www.cdc.gov  Summary  Staying at a healthy weight is important. It helps prevent certain diseases and health problems, such as heart disease, diabetes, joint problems, sleep disorders, and some cancers.  Being obese or overweight can cause you to develop joint or bone problems, which can make it hard for you to stay active or do activities you enjoy.  You can prevent unhealthy weight gain by eating  a healthy diet, exercising regularly, not smoking, limiting alcohol, and getting enough sleep.  Talk with your health care provider or a dietitian for guidance about healthy eating and healthy lifestyle choices. This information is not intended to replace advice given to you by your health care provider. Make sure you discuss any questions you have with your health care provider. Document Released: 10/19/2016 Document Revised: 11/24/2016 Document Reviewed: 11/24/2016 Elsevier Interactive Patient Education  2018 Elsevier Inc.  

## 2017-04-04 NOTE — Progress Notes (Signed)
Subjective:    Patient ID: Carrie Romero, female    DOB: 1970/11/07, 46 y.o.   MRN: [id number]  HPI  Carrie Romero is a 46 year old here today for a complete physical.  Primary Preventative Screenings: Cervical Cancer:  Follows with gyn Dr. Phineas Real; s/p hysterectomy Family Planning: s/p hysterectomy STI screening: req HIV, follows reg with gyn Dr. Phineas Real Breast Cancer: screening mammogram 03/11/17 which needed additional views of her Lt breast for which she just had along with [place] this morning. Pt reports there was something behind her left nipple and she has a 6 mo follow-up. Does have pain in the area - burning.  Colorectal Cancer: not yet aged in Tobacco use: No h/o Bone Density:  Cardiac: no prior baseline ekg  Weight/blood sugar:  Weight has been gradually increasing over the years OTC/vit/supp/herbal:  Vit D def: 08/14/16 level 28  - has started taking 2 tabs a day - she thinks 2000 iu/d total. She was taking calcium supp but ran out. Does not get much calcium in her diet.  Diet/Exercise/EtOH/substances: Exercises daily - on MWF she does spin, run, on T, Th, Sat she does an hr cardio and an hr weight lifting and she does this every single day.  She does try to switch up her work-up so her body doesn't get to adjusted - bike, skates, different calisthenics.  Typically eats just once a day and has poor diet choices. Only eats once a day. Dentist/Optho: goes regularly to both Immunizations:  Immunization History  Administered Date(s) Administered  . Tdap 06/28/2012     Chronic Medical Conditions: Anxiety: gynecology rx prn alprazoam 0.61m bid Allergic rhinitis: on zyrtec and flonase Overweight: frustrated that she continues to gain weight despite daily exercise.; Migraines: since 46 yo and been on everything. Constant HAs daily that turn into migraines. Used to do imitrex injection but stopped all meds about 2 yrs ago as wanted to do everything naturally but notes that they are  worsening and she has missed the most days. Current HA are similar to usual. Does have a aura with black/blue/purple starts, double vision, then nausea and feels presyncopal. + photo/phonophobia with n/v. Currently tries to lie down, turns the light off. 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    Review of Systems     Objective:   Physical Exam    Reviewed following labs: Lab Results  Component Value Date   HGBA1C 5.6 08/14/2016  Normal CBC 08/14/16.      Assessment & Plan:  Patient was planning on coming in for labs 2 days ago but I did not see her my chart email on time to order them so I don't think they were done. She did have a thyroid panel and lipid order active already. - Specifically requested HIV, CMP, thyroid panel, lipids Vit D?? FMLA papers - 40hrs/wk int leave for migraines 1. Annual physical exam   2. Routine screening for STI (sexually transmitted infection)   3. Encounter for screening mammogram for breast cancer   4. Screening for cardiovascular, respiratory, and genitourinary diseases   5. Screening for deficiency anemia   6. Screening for thyroid disorder   7. Enlarged thyroid   8. Family history of thyroid cancer   9. Anxiety   10. Vitamin D deficiency   11. Overweight (BMI 25.0-29.9)   12. Migraine with aura and without status migrainosus, not intractable     Orders Placed This Encounter  Procedures  .  Comprehensive metabolic panel    Order Specific Question:   Has the patient fasted?    Answer:   Yes  . HIV antibody  . Lipid panel    Order Specific Question:   Has the patient fasted?    Answer:   Yes  . Thyroid Panel With TSH  . VITAMIN D 25 Hydroxy (Vit-D Deficiency, Fractures)  . Care order/instruction:    Scheduling Instructions:     Complete orders, AVS and go.  Marland Kitchen POCT urinalysis dipstick    Meds ordered this encounter  Medications  . topiramate (TOPAMAX) 25 MG tablet    Sig: Start 1 tab po qhs x  1 wk, the 1 tab po bid x 1 wk, the 1 tab qam and 2 tabs po qhs x 1 wk the 2 tabs po bid.    Dispense:  120 tablet    Refill:  0  . SUMAtriptan (IMITREX) 100 MG tablet    Sig: Take 1 tablet (100 mg total) by mouth every 2 (two) hours as needed for migraine. May repeat in 2 hours if headache persists or recurs.    Dispense:  10 tablet    Refill:  11     Delman Cheadle, M.D.  Primary Care at [facility] [address] [place], [place] [address] [phone] phone [phone] fax  04/06/17 11:51 PM

## 2017-04-05 LAB — COMPREHENSIVE METABOLIC PANEL
A/G RATIO: 1.3 (ref 1.2–2.2)
ALT: 16 IU/L (ref 0–32)
AST: 20 IU/L (ref 0–40)
Albumin: 4 g/dL (ref 3.5–5.5)
Alkaline Phosphatase: 64 IU/L (ref 39–117)
BUN/Creatinine Ratio: 11 (ref 9–23)
BUN: 10 mg/dL (ref 6–24)
Bilirubin Total: <0.2 mg/dL (ref 0.0–1.2)
CALCIUM: 9.1 mg/dL (ref 8.7–10.2)
CHLORIDE: 109 mmol/L — AB (ref 96–106)
CO2: 18 mmol/L (ref 18–29)
Creatinine, Ser: 0.93 mg/dL (ref 0.57–1.00)
GFR calc non Af Amer: 73 mL/min/{1.73_m2} (ref >59)
GFR, EST AFRICAN AMERICAN: 85 mL/min/{1.73_m2} (ref >59)
Globulin, Total: 3.1 g/dL (ref 1.5–4.5)
Glucose: 90 mg/dL (ref 65–99)
POTASSIUM: 3.9 mmol/L (ref 3.5–5.2)
SODIUM: 142 mmol/L (ref 134–144)
TOTAL PROTEIN: 7.1 g/dL (ref 6.0–8.5)

## 2017-04-05 LAB — HIV ANTIBODY (ROUTINE TESTING W REFLEX): HIV Screen 4th Generation wRfx: NONREACTIVE

## 2017-04-05 LAB — LIPID PANEL
CHOL/HDL RATIO: 3.4 ratio (ref 0.0–4.4)
Cholesterol, Total: 181 mg/dL (ref 100–199)
HDL: 54 mg/dL (ref >39)
LDL CALC: 104 mg/dL — AB (ref 0–99)
TRIGLYCERIDES: 116 mg/dL (ref 0–149)
VLDL Cholesterol Cal: 23 mg/dL (ref 5–40)

## 2017-04-05 LAB — VITAMIN D 25 HYDROXY (VIT D DEFICIENCY, FRACTURES): VIT D 25 HYDROXY: 43.7 ng/mL (ref 30.0–100.0)

## 2017-04-05 LAB — THYROID PANEL WITH TSH
FREE THYROXINE INDEX: 1.7 (ref 1.2–4.9)
T3 UPTAKE RATIO: 26 % (ref 24–39)
T4 TOTAL: 6.4 ug/dL (ref 4.5–12.0)
TSH: 2.15 u[IU]/mL (ref 0.450–4.500)

## 2017-04-06 ENCOUNTER — Encounter: Payer: Self-pay | Admitting: Family Medicine

## 2017-04-07 ENCOUNTER — Telehealth: Payer: Self-pay

## 2017-04-07 NOTE — Telephone Encounter (Signed)
PA Started for Sumatriptan Form Placed in Dr.Shaw box to be signed and completed

## 2017-04-08 ENCOUNTER — Encounter: Payer: Self-pay | Admitting: Family Medicine

## 2017-04-08 NOTE — Telephone Encounter (Signed)
PATIENT STATES SHE HAS LEFT DR. SHAW 2 E-MAILS ASKING TO GET HER LAB RESULTS. SHE SAID ON MY-CHART IS ONLY SEES HER URINALYSIS. SHE IS VERY CONCERNED TO SEE WHAT HER THYROID  AND VITAMIN D LEVELS WERE. SHE ALSO WANTS TO SEE IF HER LIPIDS HAVE COME DOWN? BEST PHONE [phone] (CELL) PHARMACY CHOICE IS [facility] ON [address]. Minor

## 2017-04-11 ENCOUNTER — Encounter: Payer: Self-pay | Admitting: Family Medicine

## 2017-05-02 ENCOUNTER — Telehealth: Payer: Self-pay | Admitting: Family Medicine

## 2017-05-02 ENCOUNTER — Encounter: Payer: Self-pay | Admitting: Family Medicine

## 2017-05-02 ENCOUNTER — Other Ambulatory Visit: Payer: Self-pay | Admitting: Family Medicine

## 2017-05-02 DIAGNOSIS — F419 Anxiety disorder, unspecified: Secondary | ICD-10-CM

## 2017-05-02 MED ORDER — ALPRAZOLAM 0.5 MG PO TABS: 0.5000 mg | ORAL_TABLET | Freq: Two times a day (BID) | ORAL | 0 refills | Status: DC | PRN

## 2017-05-02 MED ORDER — VALACYCLOVIR HCL 500 MG PO TABS: ORAL_TABLET | ORAL | 1 refills | Status: DC

## 2017-05-02 NOTE — Telephone Encounter (Signed)
topamax dose increased and all meds sent [facility].

## 2017-05-02 NOTE — Telephone Encounter (Signed)
Pt calling about topamax prescription. Pt said Dr. Brigitte Pulse told her to call in when she was getting low but had not run out so she can work on this. The pt is going to need 50 mg twice a day. She has about a week and a half left of her prescription. Pt would like medication sent to [facility] on [address]. She also wanted to request her valtrex and alprazolam be sent to this pharmacy as well. She said [facility] was willing to send it over but preferred for it to be sent through her doctor. Please advise.

## 2017-05-03 ENCOUNTER — Telehealth: Payer: Self-pay

## 2017-05-03 ENCOUNTER — Telehealth: Payer: Self-pay | Admitting: Family Medicine

## 2017-05-03 NOTE — Telephone Encounter (Signed)
Sounds good. Thanks 

## 2017-05-03 NOTE — Telephone Encounter (Signed)
I received a fax for a PA on this medication today and submitted a PA request. I just saw this note and called the pharmacy to figure out what is going on. Last PA was approved with a paid claim on 6/8 per pharmacy. This should be a refill request. Not sure why it is requiring a PA. Will call insurance to figure this out.

## 2017-05-03 NOTE — Telephone Encounter (Signed)
Pt is needing to get a refill on her migraine medication   Best number [phone]

## 2017-05-03 NOTE — Telephone Encounter (Signed)
Called in Rx for xanax to [place]

## 2017-05-03 NOTE — Telephone Encounter (Signed)
PA Started Sumatriptan Awaiting Response

## 2017-05-03 NOTE — Telephone Encounter (Signed)
Left message on VM that dosage was increased to 50mg  and all other requested medications were sent into [facility]

## 2017-05-03 NOTE — Telephone Encounter (Signed)
Called and spoke with patient. Patient says she will try tomorrow to to get her prescription Refilled as it has not been a complete 30 days yet as of today. She states she has only been taking 2 tablets a week because she is only allotted 9 tablets per 30 days. She is still having migraines and stretching the tablets out to 2 per week is not working for her. Advised pt to try for refill on prescription tomorrow as she stated and to let us know if it goes through. Also advised pt to consider returning to the office for recheck as the sumatriptan is not effective for her twice per week.

## 2017-05-03 NOTE — Telephone Encounter (Signed)
Called the patient , left message on AM, informed the patient how many refills she has, and to check with the pharmacy.

## 2017-05-03 NOTE — Telephone Encounter (Signed)
PT CALLING FOR A PRI AUTHORIZATION ON HER IMATREX SHE STATES THAT SHE HAS SPOKEN WITH THE LADY THAT HANDLES THIS PT ALSO STATES THAT SHE DOESN'T WANT TO ASK FOR A REFILL EVERY THIRTY DAYS THAT SHE WOULD LIKE TO GET A REFILL ON MEDICINE FOE A YEAR

## 2017-05-03 NOTE — Telephone Encounter (Signed)
You can see from the chart that I sent in a year's supply of Imitrex for her on June 4. If she needs more than that she is can have to be seen.  I sent in a 6 month supply of Topamax and she will need to be settling for refills of that.   [place] noted that she completed a PA on 6/7 and put in my box. I assume I signed and put into fax file as it is not in my box now. I don't know how to tell if it was approved or denied. I don't remember seeing any denial letter.

## 2017-05-06 NOTE — Telephone Encounter (Signed)
Pt is calling to let inda know that the prior authorization for the migraine meds will go thur on the 5th of each month

## 2017-07-07 ENCOUNTER — Other Ambulatory Visit: Payer: Self-pay | Admitting: Family Medicine

## 2017-07-07 DIAGNOSIS — F419 Anxiety disorder, unspecified: Secondary | ICD-10-CM

## 2017-07-08 NOTE — Telephone Encounter (Signed)
Needs OV for any further refills which will be in 3-4 months. Please see if pt would like to schedule (if she doesn't already have an appt thanks.

## 2017-07-08 NOTE — Telephone Encounter (Signed)
Today I have utilized the [place] Controlled Substance Registry's online query to confirm compliance regarding the patient's medications. My review reveals appropriate prescription fills and that [facility] is the sole provider of these medications. Rechecks will occur regularly and the patient is aware of our use of the system.  Patient is using one alprazolam daily on average and has been for the past year and a half. Will send in 3 months supply and have patient follow up in December for further refills.

## 2017-07-11 NOTE — Telephone Encounter (Signed)
MyChart message sent to pt about making an apt for more refills

## 2017-08-01 DIAGNOSIS — R87610 Atypical squamous cells of undetermined significance on cytologic smear of cervix (ASC-US): Secondary | ICD-10-CM

## 2017-08-01 HISTORY — DX: Atypical squamous cells of undetermined significance on cytologic smear of cervix (ASC-US): R87.610

## 2017-08-11 ENCOUNTER — Telehealth: Payer: Self-pay | Admitting: *Deleted

## 2017-08-11 ENCOUNTER — Encounter: Payer: Self-pay | Admitting: *Deleted

## 2017-08-11 DIAGNOSIS — Z113 Encounter for screening for infections with a predominantly sexual mode of transmission: Secondary | ICD-10-CM

## 2017-08-11 DIAGNOSIS — Z1321 Encounter for screening for nutritional disorder: Secondary | ICD-10-CM

## 2017-08-11 DIAGNOSIS — Z1329 Encounter for screening for other suspected endocrine disorder: Secondary | ICD-10-CM

## 2017-08-11 DIAGNOSIS — Z01419 Encounter for gynecological examination (general) (routine) without abnormal findings: Secondary | ICD-10-CM

## 2017-08-11 DIAGNOSIS — Z13220 Encounter for screening for lipoid disorders: Secondary | ICD-10-CM

## 2017-08-11 NOTE — Telephone Encounter (Signed)
Order placed pt coming on 08/18/17

## 2017-08-11 NOTE — Telephone Encounter (Signed)
Okay for CBC, comprehensive metabolic panel, TSH, vitamin D, lipid profile and HIV

## 2017-08-11 NOTE — Telephone Encounter (Signed)
Pt called has annual scheduled on 08/29/17, would like labs done prior to annual appointment pt requesting. CBC,CMET,TSH, VITAMIN D, HIV testing. Please advise

## 2017-08-18 ENCOUNTER — Other Ambulatory Visit: Payer: BLUE CROSS/BLUE SHIELD

## 2017-08-18 DIAGNOSIS — Z1329 Encounter for screening for other suspected endocrine disorder: Secondary | ICD-10-CM

## 2017-08-18 DIAGNOSIS — Z13220 Encounter for screening for lipoid disorders: Secondary | ICD-10-CM

## 2017-08-18 DIAGNOSIS — Z113 Encounter for screening for infections with a predominantly sexual mode of transmission: Secondary | ICD-10-CM

## 2017-08-18 DIAGNOSIS — Z01419 Encounter for gynecological examination (general) (routine) without abnormal findings: Secondary | ICD-10-CM

## 2017-08-18 DIAGNOSIS — Z1321 Encounter for screening for nutritional disorder: Secondary | ICD-10-CM

## 2017-08-19 LAB — CBC
HCT: 31.2 % — ABNORMAL LOW (ref 35.0–45.0)
Hemoglobin: 10.3 g/dL — ABNORMAL LOW (ref 11.7–15.5)
MCH: 29.2 pg (ref 27.0–33.0)
MCHC: 33 g/dL (ref 32.0–36.0)
MCV: 88.4 fL (ref 80.0–100.0)
MPV: 10.4 fL (ref 7.5–12.5)
PLATELETS: 274 10*3/uL (ref 140–400)
RBC: 3.53 10*6/uL — ABNORMAL LOW (ref 3.80–5.10)
RDW: 13.4 % (ref 11.0–15.0)
WBC: 3.6 10*3/uL — ABNORMAL LOW (ref 3.8–10.8)

## 2017-08-19 LAB — COMPREHENSIVE METABOLIC PANEL
AG Ratio: 1.2 (calc) (ref 1.0–2.5)
ALBUMIN MSPROF: 3.7 g/dL (ref 3.6–5.1)
ALKALINE PHOSPHATASE (APISO): 75 U/L (ref 33–115)
ALT: 8 U/L (ref 6–29)
AST: 11 U/L (ref 10–35)
BUN: 8 mg/dL (ref 7–25)
CO2: 21 mmol/L (ref 20–32)
CREATININE: 0.98 mg/dL (ref 0.50–1.10)
Calcium: 8.6 mg/dL (ref 8.6–10.2)
Chloride: 113 mmol/L — ABNORMAL HIGH (ref 98–110)
GLUCOSE: 94 mg/dL (ref 65–99)
Globulin: 3.1 g/dL (calc) (ref 1.9–3.7)
Potassium: 3.4 mmol/L — ABNORMAL LOW (ref 3.5–5.3)
Sodium: 140 mmol/L (ref 135–146)
Total Bilirubin: 0.4 mg/dL (ref 0.2–1.2)
Total Protein: 6.8 g/dL (ref 6.1–8.1)

## 2017-08-19 LAB — HIV ANTIBODY (ROUTINE TESTING W REFLEX): HIV: NONREACTIVE

## 2017-08-19 LAB — LIPID PANEL
CHOLESTEROL: 178 mg/dL (ref <200)
HDL: 64 mg/dL (ref >50)
LDL CHOLESTEROL (CALC): 96 mg/dL
Non-HDL Cholesterol (Calc): 114 mg/dL (calc) (ref <130)
Total CHOL/HDL Ratio: 2.8 (calc) (ref <5.0)
Triglycerides: 85 mg/dL (ref <150)

## 2017-08-19 LAB — VITAMIN D 25 HYDROXY (VIT D DEFICIENCY, FRACTURES): Vit D, 25-Hydroxy: 60 ng/mL (ref 30–100)

## 2017-08-19 LAB — TSH: TSH: 1.33 m[IU]/L

## 2017-08-29 ENCOUNTER — Encounter: Payer: Self-pay | Admitting: Gynecology

## 2017-08-29 ENCOUNTER — Ambulatory Visit (INDEPENDENT_AMBULATORY_CARE_PROVIDER_SITE_OTHER): Payer: BLUE CROSS/BLUE SHIELD | Admitting: Gynecology

## 2017-08-29 VITALS — BP 124/80 | Ht 62.0 in | Wt 148.0 lb

## 2017-08-29 DIAGNOSIS — Z01419 Encounter for gynecological examination (general) (routine) without abnormal findings: Secondary | ICD-10-CM | POA: Diagnosis not present

## 2017-08-29 DIAGNOSIS — N898 Other specified noninflammatory disorders of vagina: Secondary | ICD-10-CM | POA: Diagnosis not present

## 2017-08-29 DIAGNOSIS — E876 Hypokalemia: Secondary | ICD-10-CM

## 2017-08-29 DIAGNOSIS — A609 Anogenital herpesviral infection, unspecified: Secondary | ICD-10-CM | POA: Diagnosis not present

## 2017-08-29 DIAGNOSIS — F419 Anxiety disorder, unspecified: Secondary | ICD-10-CM

## 2017-08-29 DIAGNOSIS — R040 Epistaxis: Secondary | ICD-10-CM

## 2017-08-29 LAB — ELECTROLYTE PANEL
CO2: 21 mmol/L (ref 20–32)
Chloride: 112 mmol/L — ABNORMAL HIGH (ref 98–110)
POTASSIUM: 3.8 mmol/L (ref 3.5–5.3)
Sodium: 141 mmol/L (ref 135–146)

## 2017-08-29 LAB — WET PREP FOR TRICH, YEAST, CLUE

## 2017-08-29 MED ORDER — VALACYCLOVIR HCL 500 MG PO TABS: ORAL_TABLET | ORAL | 1 refills | Status: DC

## 2017-08-29 MED ORDER — ALPRAZOLAM 0.5 MG PO TABS: 0.5000 mg | ORAL_TABLET | Freq: Two times a day (BID) | ORAL | 1 refills | Status: DC | PRN

## 2017-08-29 MED ORDER — FLUCONAZOLE 150 MG PO TABS: 150.0000 mg | ORAL_TABLET | Freq: Once | ORAL | 0 refills | Status: AC

## 2017-08-29 NOTE — Progress Notes (Signed)
Carrie Romero [birth year] [id number]        46 y.o.  [id number] for annual gynecologic exam.  Also complaining of:  1. Frequent nosebleeds every several days from her left nostril.  Is using an allergy type nasal spray.  No significant drainage or congestion chronically.  Spontaneous when she blows her nose and then resolves. 2. Vaginal itching.  Does have a history of recurrent vaginitis for which she was using boric acid suppression but has not used this for a long time and has not had any issues until recently.  Has noticed over the last several weeks some transient coming and going irritation.  No significant discharge.  No odor.  No urinary symptoms such as frequency dysuria urgency low back pain.  Past medical history,surgical history, problem list, medications, allergies, family history and social history were all reviewed and documented as reviewed in the EPIC chart.  ROS:  Performed with pertinent positives and negatives included in the history, assessment and plan.   Additional significant findings : None    Exam: [name] assistant Vitals:   08/29/17 0820  BP: 124/80  Weight: 148 lb (67.1 kg)  Height: 5\' 2"  (1.575 m)   Body mass index is 27.07 kg/m.  General appearance:  Normal affect, orientation and appearance. Skin: Grossly normal HEENT: Without gross lesions.  No cervical or supraclavicular adenopathy. Thyroid normal. Oto-ophthalmoscope nasal exam shows smooth pink mucosa without evidence of prior bleeding or apparent vessels. Lungs:  Clear without wheezing, rales or rhonchi Cardiac: RR, without RMG Abdominal:  Soft, nontender, without masses, guarding, rebound, organomegaly or hernia Breasts:  Examined lying and sitting without masses, retractions, discharge or axillary adenopathy. Pelvic:  Ext, BUS, Vagina: With white discharge.  Wet prep done.  Pap smear of the vaginal cuff done  Adnexa: Without masses or tenderness    Anus and perineum: Normal    Rectovaginal: Normal sphincter tone without palpated masses or tenderness.    Assessment/Plan:  46 y.o. [id number] female for annual gynecologic exam status post [place] 2012 for menorrhagia.   1. Vaginal irritation.  Wet prep is negative.  Patient says she currently is not having symptoms.  We discussed options to include observation for now and call if recurs versus providing a Diflucan 150 mg tablet to have in the event that she has a recurrence of her symptoms.  Patient would prefer the Diflucan and prescription was provided. 2. Nosebleeds.  Having nosebleeds twice weekly.  Superficial nasal exam is normal.  Recommended patient stop using the nasal spray decongestants but switched to saline nasal spray daily.  See if moisturizing the mucosa does not help stop the nosebleeds.  If they continue she will call and we will refer to ENT for further evaluation. 3. History of occasional recurrent HSV.  Uses Valtrex 500 mg twice daily times several days for outbreaks.  #30 with 3 refills provided. 4. Anxiety.  Uses Xanax 0.5 mg intermittently as needed for anxiety.  Does well with this without side effects.  #60 with 1 refill provided. 5. History of vitamin D deficiency.  Recent vitamin D level 60. 6. Mammography 04/2017.  Continue with annual mammography when due.  Breast exam normal today.  SBE monthly reviewed. 7. Health maintenance.  Lab work was done before her visit.  She does have a mild anemia at hemoglobin 10.  She does have sickle trait.  I recommended a multivitamin with iron supplementation noting her hemoglobin was 12 previously.  We will plan on recheck  in several months.  If remains low then will refer to her primary physician for further evaluation.  Her potassium level was also borderline at 3.4.  We will go ahead and recheck her electrolytes today.  Remainder of her blood work was normal.  Follow-up in 1 year, sooner if any issues.   Additional time in excess of her routine gynecologic exam was  spent in direct face to face counseling and coordination of care in regards to her vaginal irritation and nosebleeds.    Anastasio Auerbach MD, 8:48 AM 08/29/2017

## 2017-08-29 NOTE — Patient Instructions (Addendum)
Call if the nosebleeds continue after trying the nasal saline spray and we will refer to the ENT physicians for further evaluation.  Office will call you with the potassium results.  Use the Diflucan pill as needed for irritation.  Follow-up in 1 year for annual exam

## 2017-08-29 NOTE — Addendum Note (Signed)
Addended by: Nelva Nay on: 08/29/2017 09:26 AM   Modules accepted: Orders

## 2017-08-30 ENCOUNTER — Encounter: Payer: Self-pay | Admitting: *Deleted

## 2017-09-01 ENCOUNTER — Encounter: Payer: Self-pay | Admitting: Gynecology

## 2017-09-01 LAB — PAP IG W/ RFLX HPV ASCU

## 2017-09-01 LAB — HUMAN PAPILLOMAVIRUS, HIGH RISK: HPV DNA HIGH RISK: DETECTED — AB

## 2017-09-04 ENCOUNTER — Encounter: Payer: Self-pay | Admitting: Gynecology

## 2017-09-05 ENCOUNTER — Other Ambulatory Visit: Payer: Self-pay | Admitting: Gynecology

## 2017-09-05 MED ORDER — METRONIDAZOLE 500 MG PO TABS: 500.0000 mg | ORAL_TABLET | Freq: Two times a day (BID) | ORAL | 0 refills | Status: DC

## 2017-09-05 NOTE — Telephone Encounter (Signed)
Ok for flagyl 500 mg bid x 7.  No alcohol while taking.  Will see if this does not take care of it.

## 2017-09-19 ENCOUNTER — Other Ambulatory Visit: Payer: Self-pay | Admitting: Gynecology

## 2017-09-19 ENCOUNTER — Other Ambulatory Visit: Payer: Self-pay

## 2017-09-19 MED ORDER — NONFORMULARY OR COMPOUNDED ITEM: 2 refills | Status: AC

## 2017-09-19 NOTE — Telephone Encounter (Signed)
Called into pharmacy

## 2017-10-07 ENCOUNTER — Telehealth: Payer: Self-pay | Admitting: Family Medicine

## 2017-10-07 ENCOUNTER — Encounter: Payer: Self-pay | Admitting: Gynecology

## 2017-10-07 ENCOUNTER — Ambulatory Visit: Payer: BLUE CROSS/BLUE SHIELD | Admitting: Gynecology

## 2017-10-07 VITALS — BP 120/74

## 2017-10-07 DIAGNOSIS — R87610 Atypical squamous cells of undetermined significance on cytologic smear of cervix (ASC-US): Secondary | ICD-10-CM

## 2017-10-07 DIAGNOSIS — R87810 Cervical high risk human papillomavirus (HPV) DNA test positive: Secondary | ICD-10-CM | POA: Diagnosis not present

## 2017-10-07 NOTE — Patient Instructions (Addendum)
Follow-up in 1 year for annual exam and we will repeat your Pap smear with HPV screen.

## 2017-10-07 NOTE — Telephone Encounter (Signed)
Copied from [place]. Topic: Quick Communication - See Telephone Encounter >> Oct 07, 2017  3:46 PM Corie Chiquito, [place] wrote: CRM for notification. See Telephone encounter for: Patient called because she still needs a refill on her Imitrex. She has reached out to the pharmacy and was told that her medication needs a prior authorization. If someone could please give her a call back about this at [phone]  10/07/17.

## 2017-10-07 NOTE — Progress Notes (Signed)
    Carrie Romero [birth year] [id number]        46 y.o.  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  History of LGSIL 2011 proceeding her hysterectomy for menorrhagia.  Cervical specimen from hysterectomy showed no residual dysplasia.  Past medical history,surgical history, problem list, medications, allergies, family history and social history were all reviewed and documented in the EPIC chart.  Directed ROS with pertinent positives and negatives documented in the history of present illness/assessment and plan.  Exam: Carrie Romero assistant Vitals:   10/07/17 0922  BP: 120/74   General appearance:  Normal Abdomen soft nontender without masses guarding rebound Pelvic external BUS vagina normal.  No visual or palpable abnormalities.  Bimanual exam normal without masses or tenderness  Vaginal colposcopy performed after acetic acid cleanse with no abnormality seen.  Entire vagina inspected to include folds of the vaginal cuff.  Assessment/Plan:  46 y.o. 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  Prior history of LGSIL 2011.  Colposcopy today was normal.  I reviewed with the patient in detail the issues of ASCUS/dysplasia, high-grade versus low-grade, progression versus regression and the HPV association.  Recommend follow-up Pap smear in 1 year with HPV screen.  Patient agrees with the plan.    Anastasio Auerbach MD, 9:58 AM 10/07/2017

## 2017-10-10 ENCOUNTER — Ambulatory Visit: Payer: BLUE CROSS/BLUE SHIELD | Admitting: Family Medicine

## 2017-10-10 ENCOUNTER — Other Ambulatory Visit: Payer: BLUE CROSS/BLUE SHIELD

## 2017-10-10 NOTE — Telephone Encounter (Signed)
Medication needs authorization.Thanks.

## 2017-10-12 NOTE — Telephone Encounter (Signed)
Error-Close Encounter 

## 2017-10-13 MED ORDER — SUMATRIPTAN SUCCINATE 100 MG PO TABS: 100.0000 mg | ORAL_TABLET | ORAL | 11 refills | Status: AC | PRN

## 2017-10-13 NOTE — Telephone Encounter (Signed)
Dr. Brigitte Pulse, Policy only covers 9 pills per month.  Could you please change the quantity on patient's RX? There was a denial for the 10 tills on 05/17/2017 which means we would have to do an appeal (a regular PA is no longer an option). Thank you for all you do! Junie Avilla

## 2017-10-13 NOTE — Telephone Encounter (Signed)
Please advise 

## 2017-10-14 NOTE — Telephone Encounter (Signed)
Thanks for figuring out how to get this approved Renay. Sent rx to [facility].  You are constantly makig my job (and my life) immeasurably easier and better Renay with all that you do for [place]! (Not to mention our patients' lives ;). Thank YOU!

## 2017-10-24 ENCOUNTER — Ambulatory Visit
Admission: RE | Admit: 2017-10-24 | Discharge: 2017-10-24 | Disposition: A | Discharge status: Home / Self Care | Payer: BLUE CROSS/BLUE SHIELD | Source: Ambulatory Visit | Attending: Gynecology | Admitting: Gynecology

## 2017-10-24 ENCOUNTER — Other Ambulatory Visit: Payer: BLUE CROSS/BLUE SHIELD

## 2017-10-24 DIAGNOSIS — N6320 Unspecified lump in the left breast, unspecified quadrant: Secondary | ICD-10-CM

## 2017-10-31 ENCOUNTER — Ambulatory Visit: Payer: BLUE CROSS/BLUE SHIELD | Admitting: Family Medicine

## 2017-10-31 ENCOUNTER — Encounter: Payer: Self-pay | Admitting: Family Medicine

## 2017-10-31 VITALS — BP 120/86 | HR 79 | Temp 99.1°F | Resp 16 | Ht 61.0 in | Wt 145.0 lb

## 2017-10-31 DIAGNOSIS — E878 Other disorders of electrolyte and fluid balance, not elsewhere classified: Secondary | ICD-10-CM | POA: Diagnosis not present

## 2017-10-31 DIAGNOSIS — F419 Anxiety disorder, unspecified: Secondary | ICD-10-CM | POA: Diagnosis not present

## 2017-10-31 DIAGNOSIS — D649 Anemia, unspecified: Secondary | ICD-10-CM

## 2017-10-31 DIAGNOSIS — D72819 Decreased white blood cell count, unspecified: Secondary | ICD-10-CM

## 2017-10-31 LAB — POCT CBC
Granulocyte percent: 60 %G (ref 37–80)
HEMATOCRIT: 34.9 % — AB (ref 37.7–47.9)
HEMOGLOBIN: 11.3 g/dL — AB (ref 12.2–16.2)
LYMPH, POC: 1.4 (ref 0.6–3.4)
MCH, POC: 29.2 pg (ref 27–31.2)
MCHC: 32.4 g/dL (ref 31.8–35.4)
MCV: 89.9 fL (ref 80–97)
MID (cbc): 0.3 (ref 0–0.9)
MPV: 6.8 fL (ref 0–99.8)
POC GRANULOCYTE: 2.6 (ref 2–6.9)
POC LYMPH %: 32.3 % (ref 10–50)
POC MID %: 7.7 % (ref 0–12)
Platelet Count, POC: 341 10*3/uL (ref 142–424)
RBC: 3.88 M/uL — AB (ref 4.04–5.48)
RDW, POC: 13 %
WBC: 4.4 10*3/uL — AB (ref 4.6–10.2)

## 2017-10-31 MED ORDER — TOPIRAMATE 50 MG PO TABS: 50.0000 mg | ORAL_TABLET | Freq: Two times a day (BID) | ORAL | 3 refills | Status: AC

## 2017-10-31 MED ORDER — ALPRAZOLAM 0.5 MG PO TABS: 0.5000 mg | ORAL_TABLET | Freq: Two times a day (BID) | ORAL | 3 refills | Status: AC | PRN

## 2017-10-31 MED ORDER — TRIAMCINOLONE ACETONIDE 55 MCG/ACT NA AERO: 2.0000 | INHALATION_SPRAY | Freq: Every day | NASAL | 12 refills | Status: AC

## 2017-10-31 NOTE — Patient Instructions (Addendum)
   IF you received an x-ray today, you will receive an invoice from [facility]. Please contact [facility] at [phone] with questions or concerns regarding your invoice.   IF you received labwork today, you will receive an invoice from [facility]. Please contact [facility] at [phone] with questions or concerns regarding your invoice.   Our billing staff will not be able to assist you with questions regarding bills from these companies.  You will be contacted with the lab results as soon as they are available. The fastest way to get your results is to activate your My Chart account. Instructions are located on the last page of this paperwork. If you have not heard from us regarding the results in 2 weeks, please contact this office.      Major Depressive Disorder, Adult Major depressive disorder (MDD) is a mental health condition. MDD often makes you feel sad, hopeless, or helpless. MDD can also cause symptoms in your body. MDD can affect your:  Work.  School.  Relationships.  Other normal activities.  MDD can range from mild to very bad. It may occur once (single episode MDD). It can also occur many times (recurrent MDD). The main symptoms of MDD often include:  Feeling sad, depressed, or irritable most of the time.  Loss of interest.  MDD symptoms also include:  Sleeping too much or too little.  Eating too much or too little.  A change in your weight.  Feeling tired (fatigue) or having low energy.  Feeling worthless.  Feeling guilty.  Trouble making decisions.  Trouble thinking clearly.  Thoughts of suicide or harming others.  Feeling weak.  Feeling agitated.  Keeping yourself from being around other people (isolation).  Follow these instructions at home: Activity  Do these things as told by your doctor: ? Go back to your normal activities. ? Exercise regularly. ? Spend time outdoors. Alcohol  Talk with your doctor about  how alcohol can affect your antidepressant medicines.  Do not drink alcohol. Or, limit how much alcohol you drink. ? This means no more than 1 drink a day for nonpregnant women and 2 drinks a day for men. One drink equals one of these:  12 oz of beer.  5 oz of wine.  1 oz of hard liquor. General instructions  Take over-the-counter and prescription medicines only as told by your doctor.  Eat a healthy diet.  Get plenty of sleep.  Find activities that you enjoy. Make time to do them.  Think about joining a support group. Your doctor may be able to suggest a group for you.  Keep all follow-up visits as told by your doctor. This is important. Where to find more information:  National Alliance on Mental Illness: ? www.nami.org  [place] National Institute of Mental Health: ? www.nimh.nih.gov  National Suicide Prevention Lifeline: ? 1-800-273-8255. This is free, 24-hour help. Contact a doctor if:  Your symptoms get worse.  You have new symptoms. Get help right away if:  You self-harm.  You see, hear, taste, smell, or feel things that are not present (hallucinate). If you ever feel like you may hurt yourself or others, or have thoughts about taking your own life, get help right away. You can go to your nearest emergency department or call:  Your local emergency services (911 in the [place]  A suicide crisis helpline, such as the National Suicide Prevention Lifeline: ? 1-800-273-8255. This is open 24 hours a day.  This information is not intended to replace   advice given to you by your health care provider. Make sure you discuss any questions you have with your health care provider. Document Released: 09/29/2015 Document Revised: 07/04/2016 Document Reviewed: 07/04/2016 Elsevier Interactive Patient Education  2017 Elsevier Inc.  

## 2017-10-31 NOTE — Progress Notes (Signed)
Subjective:    Patient ID: Carrie Romero, female    DOB: 11-Sep-1971, 46 y.o.   MRN: [id number] Chief Complaint  Patient presents with  . Follow-up    migraines - better with topomax and imitrex    HPI Anxiety: gynecology rx prn alprazoam 0.4m bid but she would like to transfer this over here.  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   Allergic rhinitis: on zyrtec and flonase but it makes her nose bleeds.   Overweight: frustrated that she continues to gain weight despite daily exercise. Does not eat pork, beef, and bread so does not diet further and goes to the gym 5d a week with varied regimen of cardio weightlifting.   Migraines: since 46 yo and been on everything. Constant HAs daily that turn into migraines. Used to do imitrex injection but stopped all meds about 2 yrs ago as wanted to do everything naturally but notes that they are worsening and she has missed the most days. Current HA are similar to usual. Does have a aura with black/blue/purple starts, double vision, then nausea and feels presyncopal. + photo/phonophobia with n/v. Currently tries to lie down, turns the light off. And left occipital neuralgia HAs.  Has FMLA papers for migraines.  Still having about 9 migraines/mo and still having daily headaches as well but they are not as bad as prior. Started on topamax at visit >6 mos ago to help prevent migraines and whelp with weight  Labs checked 2 mos ago at gyn - looked great - lipids, vit D, tsh, cmp,, hiv  Anemia and leukopenia but + h/o sickle cell trait so rec to start mvi with iron supp. S/p hystroectomy for menorrhogia (~2012) but Is having twice weekly nose bleeds so stopped nasal spray decongesting and changed to nasal saline and refer to ENT if did not resolve.  However, she had  stopped her flonase as it burns her nose at that time so instead she RESTARTED the flonase and the epistaxis has decreased from 3x/wk to now just when she blows her nose.   K has been low on prior metabolic panels so taking otc supp now of Magnesium 500mg  and 250 of K (she thinks).   resent abnl pap with + HR HPV following with gyn Carrie Romero. Recheck in 1 yr.    Was on vitamin D daily supplement of 2000iu/d which she plans to restart.   Pt works as a Technical brewer at [employer].  Past Medical History:  Diagnosis Date  . Allergy   . 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 Depression   . GERD (gastroesophageal reflux disease)   . Hyperlipidemia   . 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 Migraine   . Sickle cell anemia (HCC)    Trait only   Past Surgical History:  Procedure Laterality Date  . CESAREAN SECTION  1996  . CHOLECYSTECTOMY  1993  . COMBINED HYSTEROSCOPY DIAGNOSTIC / D&C  05/2010  . MYOMECTOMY     2011  . TOTAL LAPR.HYSTERECTOMY  04/2011   [place]  . TUBAL LIGATION  1996   Current Outpatient Medications on File Prior to Visit  Medication Sig Dispense Refill  . CALCIUM PO Take 1,000 mg by mouth daily.  1000MG     . cetirizine (ZYRTEC) 10 MG tablet Take 10 mg by mouth 2 (two) times daily.    Marland Kitchen MAGNESIUM PO Take 1 tablet by mouth daily.     . NONFORMULARY OR COMPOUNDED ITEM boric acid 600 mg vaginal suppositories one per vagina twice weekly 30 each 2  . POTASSIUM PO Take by mouth.    . SUMAtriptan (IMITREX) 100 MG tablet Take 1 tablet (100 mg total) by mouth every 2 (two) hours as needed for migraine. May repeat in 2 hours if headache persists or recurs. 9 tablet 11  . valACYclovir (VALTREX) 500 MG tablet TAKE 1 TABLET BY MOUTH TWICE DAILY FOR 5 DAYS WITH OUTBREAKS 30 tablet 1   No current facility-administered medications on file prior to visit.    Allergies  Allergen Reactions  . Other Anaphylaxis    cantalope  . Wasp  Venom Anaphylaxis  . Hydrocodone Itching  . Watermelon [Citrullus Vulgaris] Swelling   Family History  Problem Relation Age of Onset  . Diabetes Mother   . Hypertension Mother   . Arthritis Mother   . Thyroid disease Mother   . Cancer Mother        THYROID  . Diabetes Father        pretty sure  . Hyperlipidemia Father   . Heart disease Father   . Ovarian cancer Sister   . Thyroid cancer Sister   . Cancer Sister        THYROID  . Heart attack Sister   . Breast cancer Maternal Aunt        Age 57's  . Breast cancer Cousin 30   Social History   Socioeconomic History  . Marital status: Single    Spouse name: None  . Number of children: None  . Years of education: None  . Highest education level: None  Social Needs  . Financial resource strain: None  . Food insecurity - worry: None  . Food insecurity - inability: None  . Transportation needs - medical: None  . Transportation needs - non-medical: None  Occupational History  . None  Tobacco Use  . Smoking status: Never Smoker  . Smokeless tobacco: Never Used  Substance and Sexual Activity  . Alcohol use: Yes    Alcohol/week: 4.2 oz    Types: 7 Standard drinks or equivalent per week  . Drug use: No  . Sexual activity: Not Currently    Birth control/protection: Surgical    Comment: HYST-Pt. declined sexual Hx questions  Other Topics Concern  . None  Social History Narrative  . None   Depression screen [facility] 2/9 10/31/2017 04/04/2017 06/05/2016 12/23/2015  Decreased Interest 0 0 0 0  Down, Depressed, Hopeless 1 0 0 0  PHQ - 2 Score 1 0 0 0    Review of Systems See hpi    Objective:   Physical Exam  Constitutional: She is oriented to person, place, and time. She appears well-developed and well-nourished. No distress.  HENT:  Head: Normocephalic and atraumatic.  Right Ear: External ear normal.  Left Ear: External ear normal.  Eyes: Conjunctivae are normal. No scleral icterus.  Neck: Normal range of motion. Neck  supple. No thyromegaly present.  Cardiovascular: Normal rate, regular rhythm, normal heart sounds and intact distal pulses.  Pulmonary/Chest: Effort normal and breath sounds normal. No respiratory distress.  Musculoskeletal: She exhibits no edema.  Lymphadenopathy:    She has no cervical adenopathy.  Neurological: She is alert and oriented to person, place, and  time.  Skin: Skin is warm and dry. She is not diaphoretic. No erythema.  Psychiatric: She has a normal mood and affect. Her behavior is normal.      BP 120/86   Pulse 79   Temp 99.1 F (37.3 C) (Oral)   Resp 16   Ht 5\' 1"  (1.549 m)   Wt 145 lb (65.8 kg)   LMP 10/15/2010   SpO2 100%   BMI 27.40 kg/m      Assessment & Plan:   1. Anemia, unspecified type   2. Leukopenia, unspecified type   3. Electrolyte abnormality   4. Anxiety     Orders Placed This Encounter  Procedures  . Ferritin  . Basic metabolic panel    Order Specific Question:   Has the patient fasted?    Answer:   No  . Magnesium  . Ferritin  . POCT CBC    Meds ordered this encounter  Medications  . topiramate (TOPAMAX) 50 MG tablet    Sig: Take 1 tablet (50 mg total) by mouth 2 (two) times daily.    Dispense:  180 tablet    Refill:  3  . ALPRAZolam (XANAX) 0.5 MG tablet    Sig: Take 1 tablet (0.5 mg total) by mouth 2 (two) times daily as needed for anxiety. for anxiety    Dispense:  60 tablet    Refill:  3    Please fax to [facility] at [address] Or call in - phone [phone]  . triamcinolone (NASACORT) 55 MCG/ACT AERO nasal inhaler    Sig: Place 2 sprays into the nose daily.    Dispense:  1 Inhaler    Refill:  12    Delman Cheadle, M.D.  Primary Care at [facility] [address] [place], [place] [address] [phone] phone [phone] fax  11/03/17 8:09 AM

## 2017-11-01 LAB — FERRITIN: Ferritin: 123 ng/mL (ref 15–150)

## 2017-11-01 LAB — BASIC METABOLIC PANEL
BUN/Creatinine Ratio: 7 — ABNORMAL LOW (ref 9–23)
BUN: 8 mg/dL (ref 6–24)
CALCIUM: 9.2 mg/dL (ref 8.7–10.2)
CHLORIDE: 111 mmol/L — AB (ref 96–106)
CO2: 19 mmol/L — ABNORMAL LOW (ref 20–29)
CREATININE: 1.07 mg/dL — AB (ref 0.57–1.00)
GFR calc non Af Amer: 62 mL/min/{1.73_m2} (ref >59)
GFR, EST AFRICAN AMERICAN: 71 mL/min/{1.73_m2} (ref >59)
GLUCOSE: 86 mg/dL (ref 65–99)
Potassium: 3.9 mmol/L (ref 3.5–5.2)
Sodium: 144 mmol/L (ref 134–144)

## 2017-11-01 LAB — MAGNESIUM: MAGNESIUM: 2.2 mg/dL (ref 1.6–2.3)

## 2017-11-12 ENCOUNTER — Ambulatory Visit: Payer: BLUE CROSS/BLUE SHIELD | Admitting: Physician Assistant

## 2017-11-12 ENCOUNTER — Other Ambulatory Visit: Payer: Self-pay

## 2017-11-12 ENCOUNTER — Encounter: Payer: Self-pay | Admitting: Physician Assistant

## 2017-11-12 VITALS — BP 128/82 | HR 70 | Temp 99.3°F | Resp 16 | Ht 61.0 in | Wt 142.4 lb

## 2017-11-12 DIAGNOSIS — Z114 Encounter for screening for human immunodeficiency virus [HIV]: Secondary | ICD-10-CM

## 2017-11-12 DIAGNOSIS — N760 Acute vaginitis: Secondary | ICD-10-CM | POA: Diagnosis not present

## 2017-11-12 DIAGNOSIS — N898 Other specified noninflammatory disorders of vagina: Secondary | ICD-10-CM | POA: Diagnosis not present

## 2017-11-12 DIAGNOSIS — B9689 Other specified bacterial agents as the cause of diseases classified elsewhere: Secondary | ICD-10-CM

## 2017-11-12 DIAGNOSIS — Z113 Encounter for screening for infections with a predominantly sexual mode of transmission: Secondary | ICD-10-CM

## 2017-11-12 DIAGNOSIS — F439 Reaction to severe stress, unspecified: Secondary | ICD-10-CM | POA: Diagnosis not present

## 2017-11-12 LAB — POCT WET + KOH PREP
Trich by wet prep: ABSENT
YEAST BY WET PREP: ABSENT
Yeast by KOH: ABSENT

## 2017-11-12 LAB — POCT URINE PREGNANCY: PREG TEST UR: NEGATIVE

## 2017-11-12 MED ORDER — METRONIDAZOLE 0.75 % VA GEL: 1.0000 | Freq: Two times a day (BID) | VAGINAL | 0 refills | Status: AC

## 2017-11-12 MED ORDER — METRONIDAZOLE 0.75 % VA GEL: 1.0000 | Freq: Two times a day (BID) | VAGINAL | 0 refills | Status: DC

## 2017-11-12 NOTE — Patient Instructions (Signed)
     IF you received an x-ray today, you will receive an invoice from [facility]. Please contact [facility] at [phone] with questions or concerns regarding your invoice.   IF you received labwork today, you will receive an invoice from [facility]. Please contact [facility] at [phone] with questions or concerns regarding your invoice.   Our billing staff will not be able to assist you with questions regarding bills from these companies.  You will be contacted with the lab results as soon as they are available. The fastest way to get your results is to activate your My Chart account. Instructions are located on the last page of this paperwork. If you have not heard from us regarding the results in 2 weeks, please contact this office.     
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
## 2017-11-13 LAB — HIV ANTIBODY (ROUTINE TESTING W REFLEX): HIV SCREEN 4TH GENERATION: NONREACTIVE

## 2017-11-13 LAB — RPR: RPR Ser Ql: NONREACTIVE

## 2017-11-14 ENCOUNTER — Encounter: Payer: Self-pay | Admitting: Physician Assistant

## 2017-11-15 LAB — GC/CHLAMYDIA PROBE AMP
Chlamydia trachomatis, NAA: NEGATIVE
Neisseria gonorrhoeae by PCR: NEGATIVE

## 2017-11-18 ENCOUNTER — Encounter: Payer: Self-pay | Admitting: Physician Assistant

## 2017-11-19 ENCOUNTER — Ambulatory Visit: Payer: BLUE CROSS/BLUE SHIELD | Admitting: Family Medicine

## 2017-12-12 ENCOUNTER — Other Ambulatory Visit: Payer: Self-pay | Admitting: Gynecology

## 2018-03-23 ENCOUNTER — Encounter: Payer: Self-pay | Admitting: Family Medicine

## 2019-02-28 IMAGING — US US THYROID
1 series · 14 of 25 positions shown · non-contrast
Comparison: None.

CLINICAL DATA: Thyromegaly.  Family history of thyroid carcinoma.

EXAM:
THYROID ULTRASOUND
TECHNIQUE: Ultrasound examination of the thyroid gland and adjacent soft
tissues was performed.

[Series 1: us thyroid · 0.06mm/px · 14 of 45 slices shown]
[im 1/45]
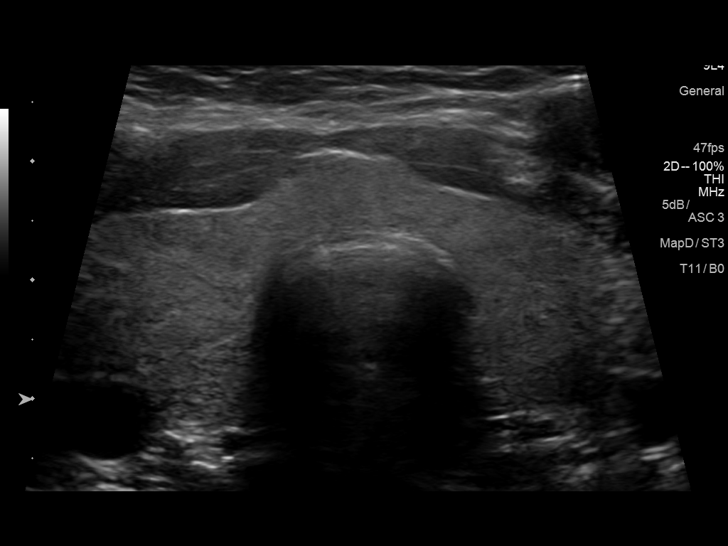
[im 4/45]
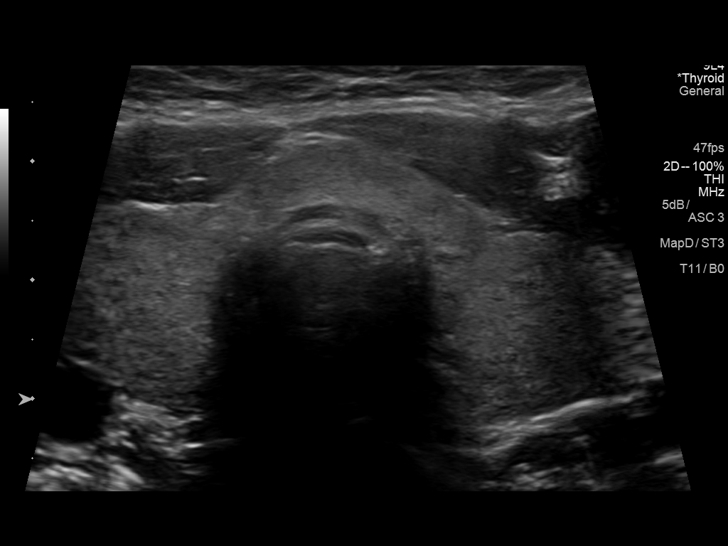
[im 8/45]
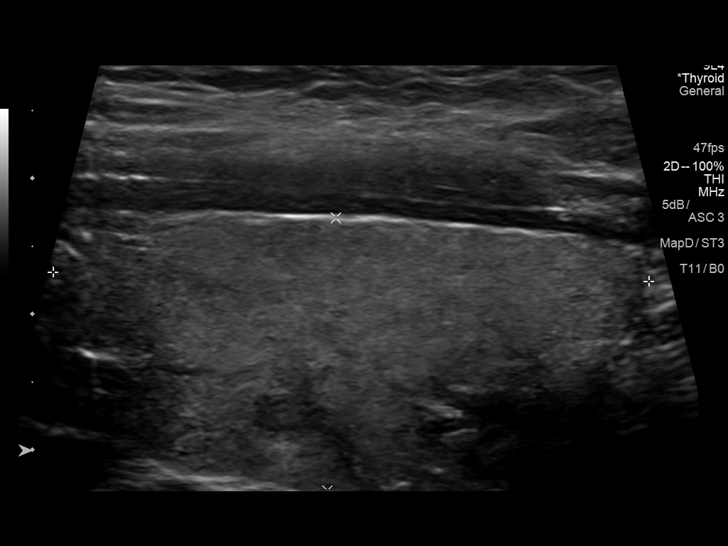
[im 12/45]
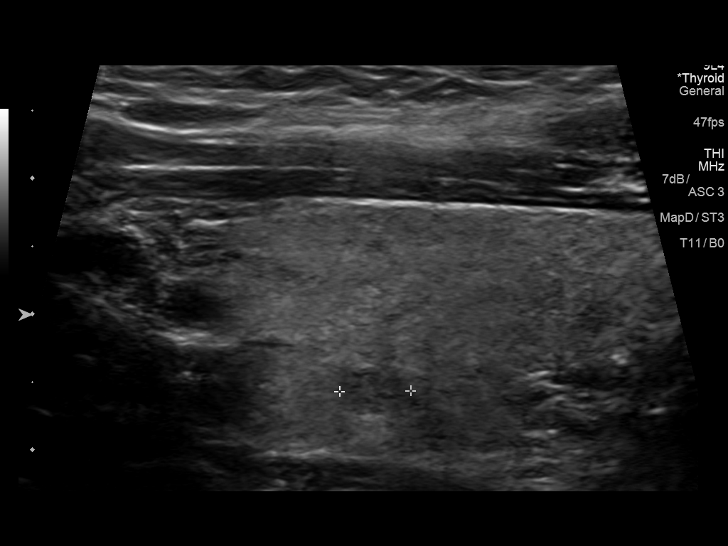
[im 15/45]
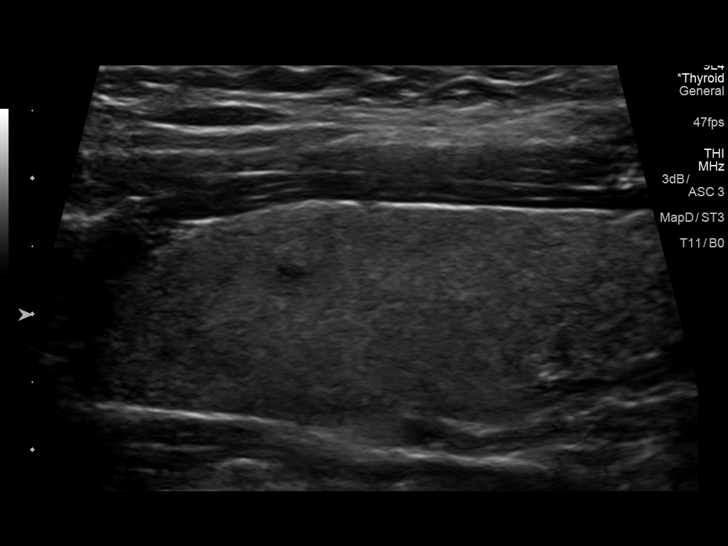
[im 17/45]
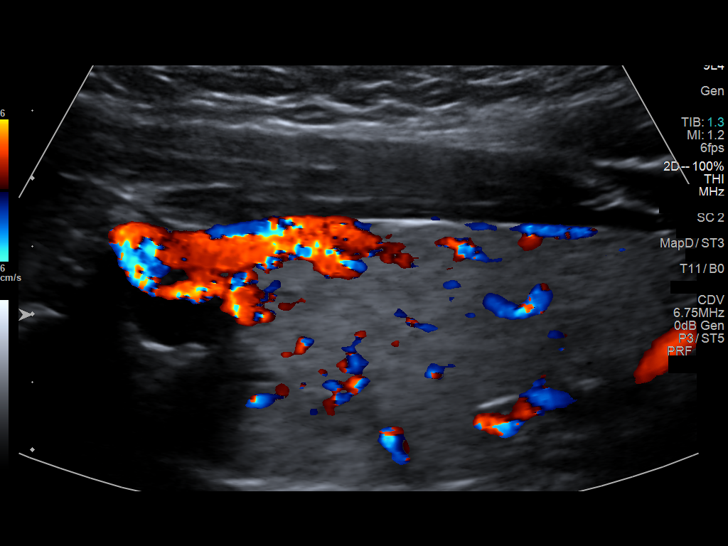
[im 21/45]
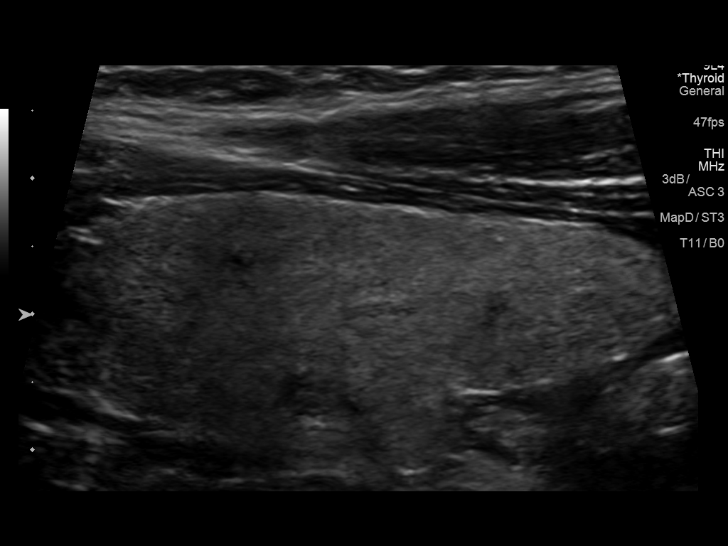
[im 24/45]
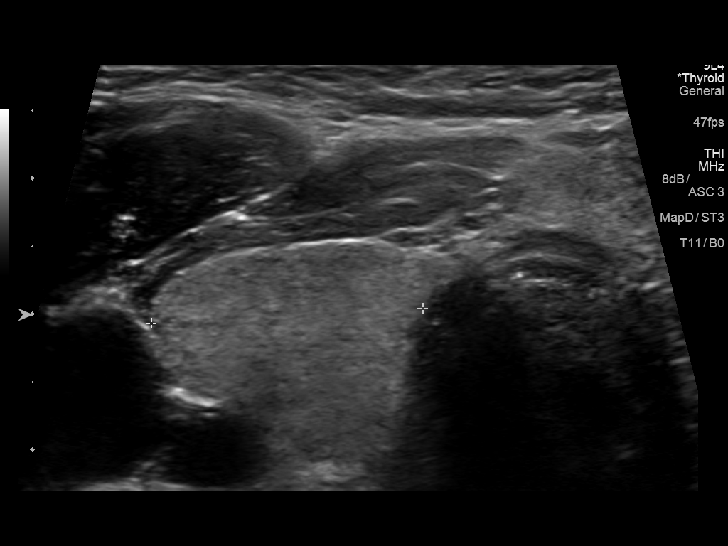
[im 28/45]
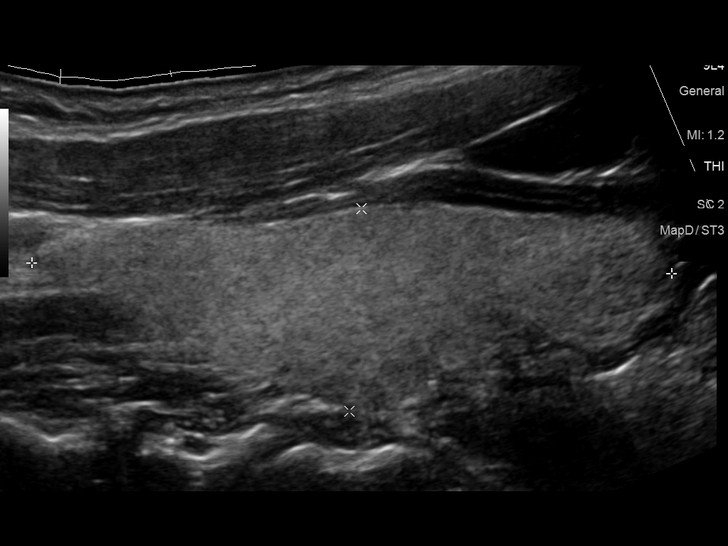
[im 30/45]
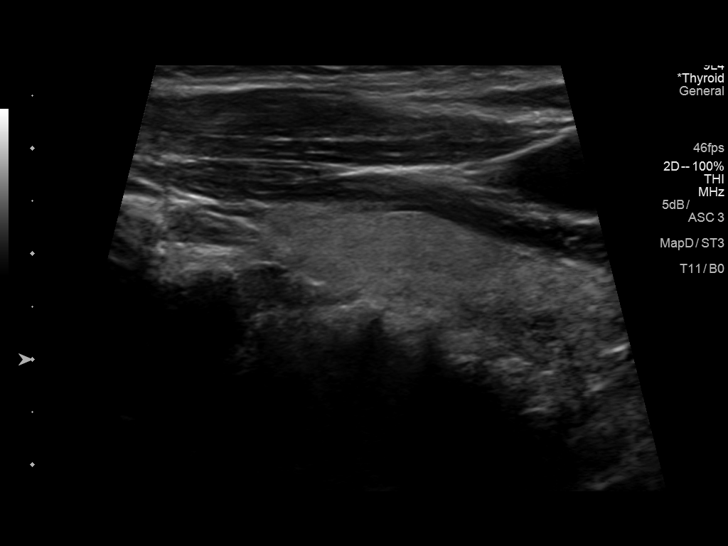
[im 34/45]
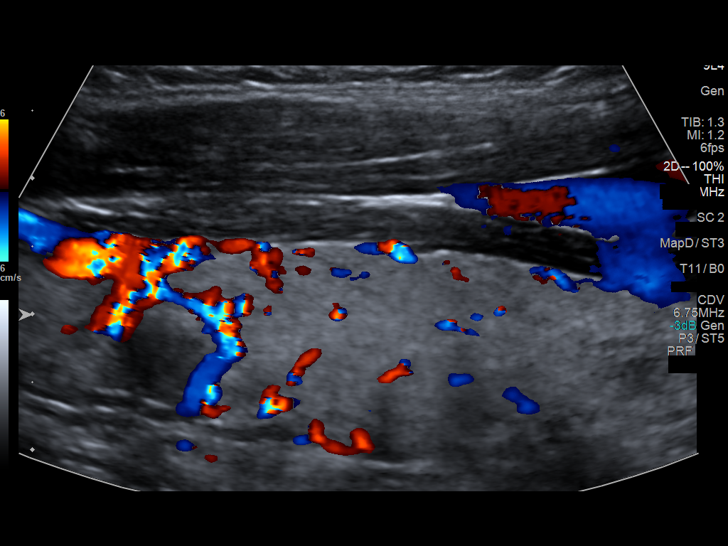
[im 37/45]
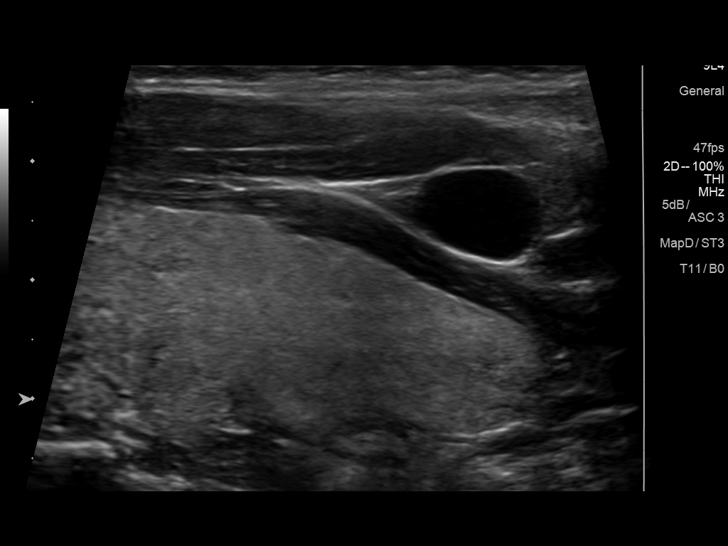
[im 41/45]
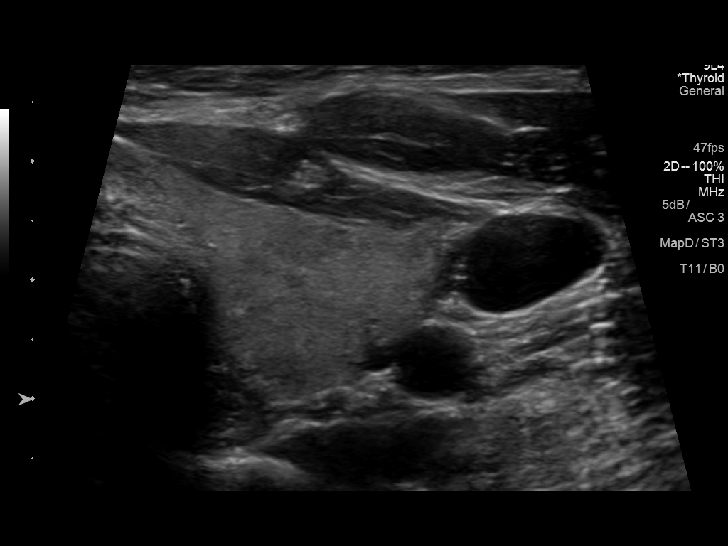
[im 45/45]
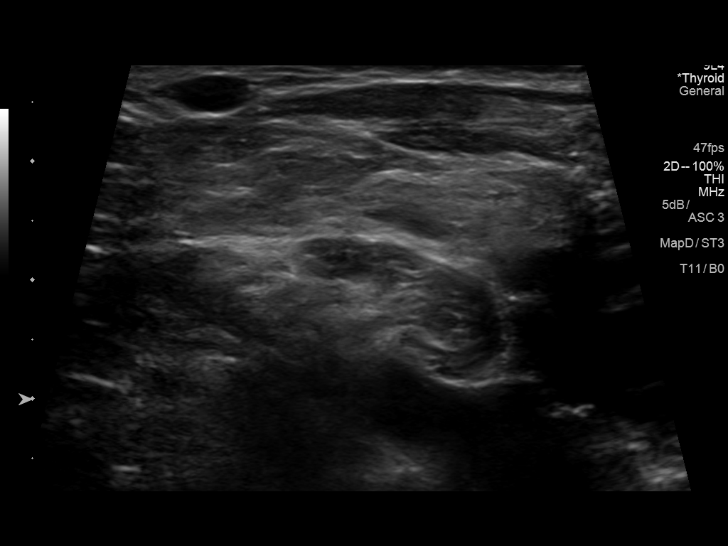

[14 of 25 positions shown; findings below may reference images not displayed]

FINDINGS: Parenchymal Echotexture: Mildly heterogenous

Isthmus: 0.7 cm thickness

Right lobe: 4.4 x 2 x 2 cm

Left lobe: 5.8 x 1.8 x 2 cm

_________________________________________________________

Estimated total number of nodules >/= 1 cm: 0

Number of spongiform nodules >/=  2 cm not described below (TR1): 0

Number of mixed cystic and solid nodules >/= 1.5 cm not described
below (TR2): 0

_________________________________________________________

A few bilateral scattered hypoechoic/cystic lesions, all smaller
than 5 mm.
IMPRESSION: 1. Borderline thyromegaly with small lesions as above. None meets
criteria for biopsy or dedicated imaging follow-up.

The above is in keeping with the ACR TI-RADS recommendations - [HOSPITAL] 3091;[DATE].

## 2019-07-25 ENCOUNTER — Encounter: Payer: Self-pay | Admitting: Gynecology
